# Patient Record
Sex: Female | Born: 1945
Health system: Southern US, Community
[De-identification: ages and names within clinical notes are randomized; demographics above are authoritative.]

## PROBLEM LIST (undated history)

## (undated) DIAGNOSIS — T8859XA Other complications of anesthesia, initial encounter: Secondary | ICD-10-CM

## (undated) DIAGNOSIS — I10 Essential (primary) hypertension: Secondary | ICD-10-CM

## (undated) DIAGNOSIS — E785 Hyperlipidemia, unspecified: Secondary | ICD-10-CM

## (undated) DIAGNOSIS — K219 Gastro-esophageal reflux disease without esophagitis: Secondary | ICD-10-CM

## (undated) DIAGNOSIS — M1990 Unspecified osteoarthritis, unspecified site: Secondary | ICD-10-CM

## (undated) DIAGNOSIS — T4145XA Adverse effect of unspecified anesthetic, initial encounter: Secondary | ICD-10-CM

## (undated) DIAGNOSIS — J189 Pneumonia, unspecified organism: Secondary | ICD-10-CM

## (undated) DIAGNOSIS — Z87442 Personal history of urinary calculi: Secondary | ICD-10-CM

## (undated) DIAGNOSIS — D126 Benign neoplasm of colon, unspecified: Secondary | ICD-10-CM

## (undated) DIAGNOSIS — M719 Bursopathy, unspecified: Secondary | ICD-10-CM

## (undated) DIAGNOSIS — K5790 Diverticulosis of intestine, part unspecified, without perforation or abscess without bleeding: Secondary | ICD-10-CM

## (undated) DIAGNOSIS — G4730 Sleep apnea, unspecified: Secondary | ICD-10-CM

## (undated) HISTORY — PX: ABDOMINAL HYSTERECTOMY: SHX81

## (undated) HISTORY — PX: JOINT REPLACEMENT: SHX530

## (undated) HISTORY — PX: CARPAL TUNNEL RELEASE: SHX101

## (undated) HISTORY — PX: DILATION AND CURETTAGE OF UTERUS: SHX78

## (undated) HISTORY — PX: TONSILLECTOMY: SUR1361

## (undated) HISTORY — PX: EYE SURGERY: SHX253

---

## 1998-12-05 ENCOUNTER — Encounter: Payer: Self-pay | Admitting: Emergency Medicine

## 1998-12-05 ENCOUNTER — Ambulatory Visit (HOSPITAL_COMMUNITY): Admission: RE | Admit: 1998-12-05 | Discharge: 1998-12-05 | Payer: Self-pay | Admitting: Internal Medicine

## 1999-01-31 ENCOUNTER — Emergency Department (HOSPITAL_COMMUNITY): Admission: EM | Admit: 1999-01-31 | Discharge: 1999-02-01 | Payer: Self-pay | Admitting: Emergency Medicine

## 1999-11-23 ENCOUNTER — Encounter: Payer: Self-pay | Admitting: Emergency Medicine

## 1999-11-23 ENCOUNTER — Ambulatory Visit (HOSPITAL_COMMUNITY): Admission: RE | Admit: 1999-11-23 | Discharge: 1999-11-23 | Payer: Self-pay | Admitting: Emergency Medicine

## 1999-12-06 ENCOUNTER — Other Ambulatory Visit: Admission: RE | Admit: 1999-12-06 | Discharge: 1999-12-06 | Payer: Self-pay | Admitting: *Deleted

## 2000-07-08 ENCOUNTER — Encounter: Payer: Self-pay | Admitting: Emergency Medicine

## 2000-07-08 ENCOUNTER — Encounter: Admission: RE | Admit: 2000-07-08 | Discharge: 2000-07-08 | Payer: Self-pay | Admitting: Emergency Medicine

## 2000-12-05 ENCOUNTER — Ambulatory Visit (HOSPITAL_COMMUNITY): Admission: RE | Admit: 2000-12-05 | Discharge: 2000-12-05 | Payer: Self-pay | Admitting: Emergency Medicine

## 2000-12-05 ENCOUNTER — Encounter: Payer: Self-pay | Admitting: Emergency Medicine

## 2001-04-03 ENCOUNTER — Encounter: Admission: RE | Admit: 2001-04-03 | Discharge: 2001-04-03 | Payer: Self-pay | Admitting: Emergency Medicine

## 2001-04-03 ENCOUNTER — Encounter: Payer: Self-pay | Admitting: Emergency Medicine

## 2003-04-14 ENCOUNTER — Encounter: Admission: RE | Admit: 2003-04-14 | Discharge: 2003-04-14 | Payer: Self-pay | Admitting: Emergency Medicine

## 2003-04-14 ENCOUNTER — Encounter: Payer: Self-pay | Admitting: Emergency Medicine

## 2003-04-21 ENCOUNTER — Encounter: Payer: Self-pay | Admitting: Emergency Medicine

## 2003-04-21 ENCOUNTER — Encounter: Admission: RE | Admit: 2003-04-21 | Discharge: 2003-04-21 | Payer: Self-pay | Admitting: Emergency Medicine

## 2003-05-07 ENCOUNTER — Encounter: Admission: RE | Admit: 2003-05-07 | Discharge: 2003-05-07 | Payer: Self-pay | Admitting: Emergency Medicine

## 2003-05-07 ENCOUNTER — Encounter: Payer: Self-pay | Admitting: Emergency Medicine

## 2003-05-12 ENCOUNTER — Ambulatory Visit (HOSPITAL_COMMUNITY): Admission: RE | Admit: 2003-05-12 | Discharge: 2003-05-12 | Payer: Self-pay | Admitting: Emergency Medicine

## 2003-05-12 ENCOUNTER — Encounter: Payer: Self-pay | Admitting: Cardiovascular Disease

## 2003-05-17 ENCOUNTER — Encounter: Admission: RE | Admit: 2003-05-17 | Discharge: 2003-05-17 | Payer: Self-pay | Admitting: Emergency Medicine

## 2003-05-17 ENCOUNTER — Encounter: Payer: Self-pay | Admitting: Emergency Medicine

## 2003-06-01 ENCOUNTER — Encounter: Admission: RE | Admit: 2003-06-01 | Discharge: 2003-06-01 | Payer: Self-pay | Admitting: Emergency Medicine

## 2003-06-01 ENCOUNTER — Encounter: Payer: Self-pay | Admitting: Emergency Medicine

## 2003-06-08 ENCOUNTER — Ambulatory Visit (HOSPITAL_COMMUNITY): Admission: RE | Admit: 2003-06-08 | Discharge: 2003-06-08 | Payer: Self-pay | Admitting: Emergency Medicine

## 2003-06-08 ENCOUNTER — Encounter: Payer: Self-pay | Admitting: Emergency Medicine

## 2004-02-17 ENCOUNTER — Encounter: Admission: RE | Admit: 2004-02-17 | Discharge: 2004-02-17 | Payer: Self-pay | Admitting: Gastroenterology

## 2004-02-27 ENCOUNTER — Encounter
Admission: RE | Admit: 2004-02-27 | Discharge: 2004-02-27 | Payer: Self-pay | Admitting: Physical Medicine and Rehabilitation

## 2004-03-08 ENCOUNTER — Ambulatory Visit (HOSPITAL_COMMUNITY): Admission: RE | Admit: 2004-03-08 | Discharge: 2004-03-08 | Payer: Self-pay | Admitting: Gastroenterology

## 2004-03-08 ENCOUNTER — Encounter (INDEPENDENT_AMBULATORY_CARE_PROVIDER_SITE_OTHER): Payer: Self-pay | Admitting: Specialist

## 2004-04-19 ENCOUNTER — Ambulatory Visit (HOSPITAL_COMMUNITY): Admission: RE | Admit: 2004-04-19 | Discharge: 2004-04-19 | Payer: Self-pay | Admitting: Internal Medicine

## 2005-07-21 IMAGING — NM NM HEPATO W/GB/PHARM/[PERSON_NAME]
2 series · 12 of 12 positions shown · non-contrast
Comparison: none

CLINICAL DATA: Nausea.
 NUCLEAR MEDICINE HEPATOBILIARY SCAN WITH EJECTION FRACTION
 Routine imaging was carried [DATE] minutes following IV injection of 5.0 mCi of 6echnetium-LLm Choletec.
 There is prompt visualization of the biliary tree, gallbladder, and small bowel.  
 Following 8 ounces of Half-and-Half cream, ejection fraction is calculated at 70% at 30 minutes.
 IMPRESSION
 1.  Patent cystic and common bile duct. 
 2.  The gallbladder contracts physiologically.

[gb hepatobiliary · 4.66mm/px · 6 of 12 frames shown (1 of 2)]
[frame 2/12]
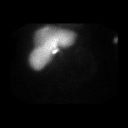
[frame 4/12]
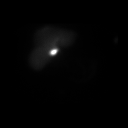
[frame 6/12]
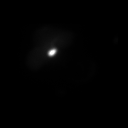
[frame 8/12]
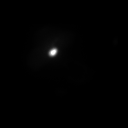
[frame 10/12]
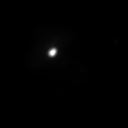
[frame 12/12]
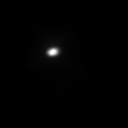

[gb hepatobiliary · 4.66mm/px · 6 of 30 frames shown (2 of 2)]
[frame 3/30]
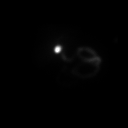
[frame 8/30]
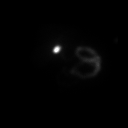
[frame 13/30]
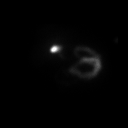
[frame 18/30]
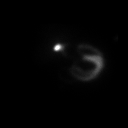
[frame 23/30]
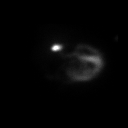
[frame 28/30]
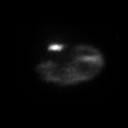

[12 of 12 positions shown; findings below may reference images not displayed]

## 2005-10-31 ENCOUNTER — Encounter: Admission: RE | Admit: 2005-10-31 | Discharge: 2005-10-31 | Payer: Self-pay | Admitting: Orthopedic Surgery

## 2008-04-07 ENCOUNTER — Encounter: Payer: Self-pay | Admitting: Emergency Medicine

## 2008-06-15 ENCOUNTER — Ambulatory Visit: Payer: Self-pay | Admitting: Cardiology

## 2008-06-23 ENCOUNTER — Encounter: Payer: Self-pay | Admitting: Cardiology

## 2008-06-23 ENCOUNTER — Ambulatory Visit: Payer: Self-pay

## 2008-06-25 ENCOUNTER — Ambulatory Visit: Payer: Self-pay

## 2008-07-14 ENCOUNTER — Ambulatory Visit: Payer: Self-pay | Admitting: Cardiology

## 2008-08-19 ENCOUNTER — Ambulatory Visit: Payer: Self-pay | Admitting: Emergency Medicine

## 2008-08-19 DIAGNOSIS — F3289 Other specified depressive episodes: Secondary | ICD-10-CM | POA: Insufficient documentation

## 2008-08-19 DIAGNOSIS — M4800 Spinal stenosis, site unspecified: Secondary | ICD-10-CM

## 2008-08-19 DIAGNOSIS — J45909 Unspecified asthma, uncomplicated: Secondary | ICD-10-CM | POA: Insufficient documentation

## 2008-08-19 DIAGNOSIS — E785 Hyperlipidemia, unspecified: Secondary | ICD-10-CM | POA: Insufficient documentation

## 2008-08-19 DIAGNOSIS — K219 Gastro-esophageal reflux disease without esophagitis: Secondary | ICD-10-CM

## 2008-08-19 DIAGNOSIS — G5600 Carpal tunnel syndrome, unspecified upper limb: Secondary | ICD-10-CM

## 2008-08-19 DIAGNOSIS — F329 Major depressive disorder, single episode, unspecified: Secondary | ICD-10-CM

## 2008-08-19 DIAGNOSIS — R059 Cough, unspecified: Secondary | ICD-10-CM | POA: Insufficient documentation

## 2008-08-19 DIAGNOSIS — G4730 Sleep apnea, unspecified: Secondary | ICD-10-CM | POA: Insufficient documentation

## 2008-08-19 DIAGNOSIS — R05 Cough: Secondary | ICD-10-CM

## 2008-08-19 DIAGNOSIS — I10 Essential (primary) hypertension: Secondary | ICD-10-CM | POA: Insufficient documentation

## 2008-09-16 ENCOUNTER — Ambulatory Visit: Payer: Self-pay | Admitting: Emergency Medicine

## 2008-09-16 DIAGNOSIS — J984 Other disorders of lung: Secondary | ICD-10-CM | POA: Insufficient documentation

## 2010-05-09 ENCOUNTER — Ambulatory Visit (HOSPITAL_COMMUNITY): Admission: RE | Admit: 2010-05-09 | Discharge: 2010-05-09 | Payer: Self-pay | Admitting: Orthopedic Surgery

## 2010-06-09 ENCOUNTER — Inpatient Hospital Stay (HOSPITAL_COMMUNITY): Admission: RE | Admit: 2010-06-09 | Discharge: 2010-06-14 | Payer: Self-pay | Admitting: Orthopedic Surgery

## 2010-09-18 ENCOUNTER — Telehealth (INDEPENDENT_AMBULATORY_CARE_PROVIDER_SITE_OTHER): Payer: Self-pay | Admitting: *Deleted

## 2010-09-22 ENCOUNTER — Inpatient Hospital Stay (HOSPITAL_COMMUNITY): Admission: RE | Admit: 2010-09-22 | Discharge: 2010-09-27 | Payer: Self-pay | Admitting: Orthopedic Surgery

## 2011-01-11 NOTE — Progress Notes (Signed)
Summary: Records Request  Faxed Stress & EKG to [facility] at [facility] ([phone]). Debby Freiberg  September 18, 2010 11:19 AM

## 2011-02-21 LAB — CBC
HCT: 39.4 % (ref 36.0–46.0)
HCT: 39.6 % (ref 36.0–46.0)
MCH: 30 pg (ref 26.0–34.0)
MCH: 30.2 pg (ref 26.0–34.0)
MCHC: 31.6 g/dL (ref 30.0–36.0)
MCHC: 32.4 g/dL (ref 30.0–36.0)
MCV: 94.7 fL (ref 78.0–100.0)
MCV: 95.2 fL (ref 78.0–100.0)
Platelets: 201 10*3/uL (ref 150–400)
Platelets: 206 10*3/uL (ref 150–400)
RBC: 4.1 MIL/uL (ref 3.87–5.11)
RBC: 4.16 MIL/uL (ref 3.87–5.11)
RBC: 4.16 MIL/uL (ref 3.87–5.11)
RDW: 15 % (ref 11.5–15.5)
WBC: 15.4 10*3/uL — ABNORMAL HIGH (ref 4.0–10.5)
WBC: 17.2 10*3/uL — ABNORMAL HIGH (ref 4.0–10.5)
WBC: 17.9 10*3/uL — ABNORMAL HIGH (ref 4.0–10.5)

## 2011-02-21 LAB — GLUCOSE, CAPILLARY
Glucose-Capillary: 133 mg/dL — ABNORMAL HIGH (ref 70–99)
Glucose-Capillary: 134 mg/dL — ABNORMAL HIGH (ref 70–99)
Glucose-Capillary: 148 mg/dL — ABNORMAL HIGH (ref 70–99)
Glucose-Capillary: 168 mg/dL — ABNORMAL HIGH (ref 70–99)

## 2011-02-21 LAB — BASIC METABOLIC PANEL
BUN: 12 mg/dL (ref 6–23)
BUN: 5 mg/dL — ABNORMAL LOW (ref 6–23)
BUN: 6 mg/dL (ref 6–23)
CO2: 31 mEq/L (ref 19–32)
Chloride: 102 mEq/L (ref 96–112)
Creatinine, Ser: 0.74 mg/dL (ref 0.4–1.2)
GFR calc Af Amer: >60 mL/min (ref >60)
GFR calc non Af Amer: >60 mL/min (ref >60)
GFR calc non Af Amer: >60 mL/min (ref >60)
Glucose, Bld: 193 mg/dL — ABNORMAL HIGH (ref 70–99)
Potassium: 3 mEq/L — ABNORMAL LOW (ref 3.5–5.1)
Potassium: 3.4 mEq/L — ABNORMAL LOW (ref 3.5–5.1)
Sodium: 136 mEq/L (ref 135–145)

## 2011-02-22 LAB — URINALYSIS, ROUTINE W REFLEX MICROSCOPIC
Bilirubin Urine: NEGATIVE
Hgb urine dipstick: NEGATIVE
Ketones, ur: NEGATIVE mg/dL
Nitrite: NEGATIVE
pH: 7.5 (ref 5.0–8.0)

## 2011-02-22 LAB — TYPE AND SCREEN
ABO/RH(D): A POS
ABO/RH(D): A POS
Antibody Screen: NEGATIVE
Antibody Screen: NEGATIVE

## 2011-02-22 LAB — PROTIME-INR: Prothrombin Time: 12.6 seconds (ref 11.6–15.2)

## 2011-02-22 LAB — APTT: aPTT: 25 seconds (ref 24–37)

## 2011-02-22 LAB — BASIC METABOLIC PANEL
BUN: 9 mg/dL (ref 6–23)
Calcium: 9.7 mg/dL (ref 8.4–10.5)
Creatinine, Ser: 0.63 mg/dL (ref 0.4–1.2)
GFR calc Af Amer: >60 mL/min (ref >60)

## 2011-02-22 LAB — CBC
HCT: 43 % (ref 36.0–46.0)
Hemoglobin: 14.3 g/dL (ref 12.0–15.0)
RBC: 4.62 MIL/uL (ref 3.87–5.11)

## 2011-02-22 LAB — GLUCOSE, CAPILLARY
Glucose-Capillary: 115 mg/dL — ABNORMAL HIGH (ref 70–99)
Glucose-Capillary: 137 mg/dL — ABNORMAL HIGH (ref 70–99)

## 2011-02-22 LAB — DIFFERENTIAL
Eosinophils Relative: 1 % (ref 0–5)
Lymphocytes Relative: 20 % (ref 12–46)
Lymphs Abs: 2.9 10*3/uL (ref 0.7–4.0)
Monocytes Absolute: 0.7 10*3/uL (ref 0.1–1.0)
Monocytes Relative: 5 % (ref 3–12)
Neutro Abs: 10.6 10*3/uL — ABNORMAL HIGH (ref 1.7–7.7)

## 2011-02-22 LAB — SURGICAL PCR SCREEN: MRSA, PCR: NEGATIVE

## 2011-02-22 LAB — ABO/RH: ABO/RH(D): A POS

## 2011-02-25 LAB — GLUCOSE, CAPILLARY
Glucose-Capillary: 134 mg/dL — ABNORMAL HIGH (ref 70–99)
Glucose-Capillary: 134 mg/dL — ABNORMAL HIGH (ref 70–99)
Glucose-Capillary: 136 mg/dL — ABNORMAL HIGH (ref 70–99)
Glucose-Capillary: 138 mg/dL — ABNORMAL HIGH (ref 70–99)
Glucose-Capillary: 145 mg/dL — ABNORMAL HIGH (ref 70–99)
Glucose-Capillary: 153 mg/dL — ABNORMAL HIGH (ref 70–99)
Glucose-Capillary: 156 mg/dL — ABNORMAL HIGH (ref 70–99)
Glucose-Capillary: 157 mg/dL — ABNORMAL HIGH (ref 70–99)
Glucose-Capillary: 164 mg/dL — ABNORMAL HIGH (ref 70–99)

## 2011-02-25 LAB — CBC
HCT: 35 % — ABNORMAL LOW (ref 36.0–46.0)
HCT: 37 % (ref 36.0–46.0)
Hemoglobin: 12 g/dL (ref 12.0–15.0)
Hemoglobin: 12.6 g/dL (ref 12.0–15.0)
Hemoglobin: 13.6 g/dL (ref 12.0–15.0)
MCH: 31.3 pg (ref 26.0–34.0)
MCH: 31.5 pg (ref 26.0–34.0)
MCH: 31.5 pg (ref 26.0–34.0)
MCH: 30.5 pg (ref 26.0–34.0)
MCHC: 33.9 g/dL (ref 30.0–36.0)
MCHC: 34.1 g/dL (ref 30.0–36.0)
MCV: 91.5 fL (ref 78.0–100.0)
MCV: 92.6 fL (ref 78.0–100.0)
MCV: 92.7 fL (ref 78.0–100.0)
MCV: 92.8 fL (ref 78.0–100.0)
Platelets: 183 K/uL (ref 150–400)
Platelets: 208 10*3/uL (ref 150–400)
RBC: 3.83 MIL/uL — ABNORMAL LOW (ref 3.87–5.11)
RBC: 3.97 MIL/uL (ref 3.87–5.11)
RBC: 4 MIL/uL (ref 3.87–5.11)
RBC: 4.47 MIL/uL (ref 3.87–5.11)
RDW: 14.4 % (ref 11.5–15.5)
WBC: 17.1 K/uL — ABNORMAL HIGH (ref 4.0–10.5)

## 2011-02-25 LAB — PROTIME-INR
INR: 1.22 (ref 0.00–1.49)
INR: 2.22 — ABNORMAL HIGH (ref 0.00–1.49)
Prothrombin Time: 15.3 seconds — ABNORMAL HIGH (ref 11.6–15.2)
Prothrombin Time: 21.8 seconds — ABNORMAL HIGH (ref 11.6–15.2)
Prothrombin Time: 23.7 seconds — ABNORMAL HIGH (ref 11.6–15.2)
Prothrombin Time: 24.4 s — ABNORMAL HIGH (ref 11.6–15.2)

## 2011-02-25 LAB — BASIC METABOLIC PANEL WITH GFR
BUN: 13 mg/dL (ref 6–23)
CO2: 29 meq/L (ref 19–32)
Calcium: 8.4 mg/dL (ref 8.4–10.5)
Chloride: 106 meq/L (ref 96–112)
Creatinine, Ser: 0.71 mg/dL (ref 0.4–1.2)
GFR calc non Af Amer: >60 mL/min
Glucose, Bld: 171 mg/dL — ABNORMAL HIGH (ref 70–99)
Potassium: 3.8 meq/L (ref 3.5–5.1)
Sodium: 142 meq/L (ref 135–145)

## 2011-02-25 LAB — BASIC METABOLIC PANEL
BUN: 11 mg/dL (ref 6–23)
CO2: 31 mEq/L (ref 19–32)
CO2: 31 mEq/L (ref 19–32)
Chloride: 102 mEq/L (ref 96–112)
Chloride: 103 mEq/L (ref 96–112)
Chloride: 105 mEq/L (ref 96–112)
Creatinine, Ser: 0.64 mg/dL (ref 0.4–1.2)
GFR calc Af Amer: >60 mL/min (ref >60)
GFR calc Af Amer: >60 mL/min (ref >60)
Glucose, Bld: 180 mg/dL — ABNORMAL HIGH (ref 70–99)
Potassium: 3.8 mEq/L (ref 3.5–5.1)
Sodium: 139 mEq/L (ref 135–145)
Sodium: 143 mEq/L (ref 135–145)

## 2011-02-25 LAB — DIFFERENTIAL
Eosinophils Absolute: 0 10*3/uL (ref 0.0–0.7)
Eosinophils Relative: 0 % (ref 0–5)
Lymphs Abs: 3.2 10*3/uL (ref 0.7–4.0)
Monocytes Absolute: 0.8 10*3/uL (ref 0.1–1.0)
Monocytes Relative: 5 % (ref 3–12)

## 2011-02-25 LAB — URINALYSIS, ROUTINE W REFLEX MICROSCOPIC
Glucose, UA: NEGATIVE mg/dL
Glucose, UA: NEGATIVE mg/dL
Hgb urine dipstick: NEGATIVE
Protein, ur: NEGATIVE mg/dL
pH: 6 (ref 5.0–8.0)

## 2011-02-25 LAB — TYPE AND SCREEN: Antibody Screen: NEGATIVE

## 2011-02-25 LAB — URINE MICROSCOPIC-ADD ON

## 2011-02-25 LAB — ABO/RH: ABO/RH(D): A POS

## 2011-02-26 LAB — BASIC METABOLIC PANEL
BUN: 8 mg/dL (ref 6–23)
Chloride: 103 mEq/L (ref 96–112)
Glucose, Bld: 244 mg/dL — ABNORMAL HIGH (ref 70–99)
Potassium: 3.4 mEq/L — ABNORMAL LOW (ref 3.5–5.1)

## 2011-02-26 LAB — CBC
HCT: 40.5 % (ref 36.0–46.0)
MCV: 91.6 fL (ref 78.0–100.0)
Platelets: 255 10*3/uL (ref 150–400)
RDW: 14.9 % (ref 11.5–15.5)
WBC: 12.6 10*3/uL — ABNORMAL HIGH (ref 4.0–10.5)

## 2011-02-26 LAB — PROTIME-INR: Prothrombin Time: 13.5 seconds (ref 11.6–15.2)

## 2011-02-26 LAB — DIFFERENTIAL
Eosinophils Absolute: 0.1 10*3/uL (ref 0.0–0.7)
Eosinophils Relative: 1 % (ref 0–5)
Lymphs Abs: 2 10*3/uL (ref 0.7–4.0)
Monocytes Absolute: 0.5 10*3/uL (ref 0.1–1.0)

## 2011-02-26 LAB — URINALYSIS, ROUTINE W REFLEX MICROSCOPIC
Bilirubin Urine: NEGATIVE
Glucose, UA: NEGATIVE mg/dL
Ketones, ur: NEGATIVE mg/dL
pH: 7 (ref 5.0–8.0)

## 2011-04-24 NOTE — Assessment & Plan Note (Signed)
[facility]                            CARDIOLOGY OFFICE NOTE   NAME:Margaret Rocha                      MRN:          [id number]  DATE:07/14/2008                            DOB:          1946/03/31    PRIMARY CARE PHYSICIAN:  Margaret Rocha   REASON FOR PRESENTATION:  Evaluate the patient with cough and coronary  calcification on CT.   HISTORY OF PRESENT ILLNESS:  This is a second office visit for this  pleasant 65 year old.  She was referred for evaluation of a cough.  She  also had a CT demonstrating possible pulmonary hypertension and coronary  calcium.  To evaluate the coronary calcium, I did send her for a stress  perfusion study, which demonstrated an ejection fraction of 55%, and no  evidence of ischemia or infarct.  An echocardiogram to evaluate her  right ventricular size and pulmonary pressures was somewhat suboptimal  with not great acoustic windows.  She had an overall well-preserved left  ventricular function with no wall motion abnormalities.  The RV was not  well visualized, though there was no obvious tricuspid regurgitation or  elevated pulmonary pressures.  The inferior vena cava appeared to be  normal and collapsing.   The patient has since had her medications changed to include Kapidex to  see if this might help her coughing.  She states her coughing is  somewhat better, though she is still doing this.  She has not been  having any new symptoms.  She joined the wine, is going to start some  water aerobics.  With her current level of activity, she is not having  any chest pressure, neck or arm discomfort.  She is not having any  palpitations, presyncope, or syncope.  She is not having any PND or  orthopnea.   PAST MEDICAL HISTORY:  Diabetes mellitus, borderline; hypertension times  many years; sleep apnea, on CPAP; dyslipidemia; depression; carpal  tunnel syndrome; asthma/bronchitis; reflux; insomnia; cataracts;  hysterectomy.   ALLERGIES/INTOLERANCES:  None.   MEDICATIONS:  1. Diovan HCT 320/25.  2. Kapidex 60 mg daily.  3. Cymbalta 60 mg daily.  4. Veramyst.  5. Wellbutrin 150 mg daily.  6. Ditropan 5 mg daily.  7. Folic acid 1 mg daily.  8. Fish Oil 1000 mg daily.  9. Micro-K 20 mEq b.i.d.  10.Cyanocobalamin.  11.Aspirin 81 mg daily.   REVIEW OF SYSTEMS:  As stated in the HPI and otherwise negative for  other systems.   PHYSICAL EXAMINATION:  GENERAL:  The patient is in no distress.  VITAL SIGNS:  Blood pressure 150/90, heart rate 80 and regular, weight  242 pounds, body mass index 47.5.  HEENT:  Eyes unremarkable; pupils equal, round, and react to light;  fundi not visualized; oral mucosa unremarkable.  NECK:  No jugular distention at 45 degrees; carotid upstroke brisk and  symmetric; no bruits, no thyromegaly.  LYMPHATICS:  No cervical, axillary, or inguinal adenopathy.  LUNGS:  Clear to auscultation bilaterally.  HEART:  PMI not displaced or sustained; S1 and S2 within normal limits;  no S3, no murmurs.  ABDOMEN:  Morbidly obese; positive bowel sounds, normal in frequency and  pitch; no bruits, rebound, guarding or midline pulsatile mass; no  organomegaly.  SKIN:  No rashes, no nodules.  EXTREMITIES:  A 2+ pulses, no edema.   ASSESSMENT AND PLAN:  1. Cough.  The patient's cough is perhaps slightly better with the      Kapidex.  At this point, I do not see an overt cardiac etiology.      It could be her pulmonary hypertension.  It is very difficult to      demonstrate on physical exam given her size or on echo.  However,      this is not overt.  If there is some pulmonary hypertension, I      suspect it would be multifactorial and somewhat related to her      weight and perhaps her primary lung problem.  I have given her Dr.      Kavin Leech name.  She was seen by pulmonologist in the past in [facility], but was not satisfied.  Dr. Delton Coombes is pulmonologist in our       office, and I would suggest she see him next about the cough.  If      there is any further question about the cardiac etiology, I      consider right heart catheterization.  2. Coronary calcification.  The patient definitely has some coronary      plaque.  However, it does not appear to be obstructive according to      the stress test.  She should have aggressive risk reduction.  3. Dyslipidemia.  The patient does have significant cardiovascular      risk factors and borderline diabetes.  Given this, I would suggest      a statin with goal LDL less than 100 and HDL greater than 50.  She      wants to have this conversation with her primary care physician,      and I will defer.  4. Obesity.  She understands the need to lose weight with diet and      exercise.  5. Followup.  The patient will come back to see me as needed.     Rollene Rotunda, MD, [facility]  Electronically Signed    JH/MedQ  DD: 07/14/2008  DT: 07/15/2008  Job #: [id number]   cc:   Philemon Kingdom

## 2011-04-24 NOTE — Assessment & Plan Note (Signed)
[facility]                            CARDIOLOGY OFFICE NOTE   NAME:Margaret Rocha, Margaret Rocha                      MRN:          [id number]  DATE:06/15/2008                            DOB:          June 26, 1946    PRIMARY CARE PHYSICIAN:  Philemon Kingdom.   REASON FOR PRESENTATION:  Evaluate the patient with pulmonary  hypertension, coronary artery disease noted on a CT and cough.   HISTORY OF PRESENT ILLNESS:  The patient is a pleasant 65 year old who I  saw in 2004.  At that time she had some dyspnea.  An echocardiogram did  not suggest a cardiovascular cause.  No further cardiovascular testing  was suggested.   The patient had done relatively well until January.  She developed a  cough.  This has been progressive.  It is daily.  She says it happens  very frequently.  It is typically nonproductive though very occasionally  productive of thick sputum.  It keeps her up at night.  She does not  associate it with any foods.  It does not happen with any particular  activity.  She was recently appropriately taken off benazepril for the  last 5 weeks, but still has not had any improvement.  She has been on  Diovan instead.  She used to have heartburn, but she does not get this  anymore.  She does not have any shortness of breath.  She denies any PND  or orthopnea.  She has no chest discomfort, neck or arm discomfort.  She  has no palpitation.  She did have an episode of presyncope and saw a  cardiologist, had a 24-hour Holter monitor, but there was apparently no  dysrhythmia.   Most recently, she had a chest CT to evaluate this cough.  This  suggested pulmonary hypertension with an enlarged pulmonary artery.  It  also mentioned multivessel coronary artery disease apparently  identifying calcification.  The patient is now referred for evaluation  of these findings and her cough.   PAST MEDICAL HISTORY:  1. Diabetes mellitus, borderline.  2. Hypertension  times many years.  3. Sleep apnea on CPAP.  4. Hyperlipidemia.  5. Depression.  6. Carpal tunnel syndrome.  7. Asthma/bronchitis.  8. Reflux.  9. Insomnia.  10.Cataracts.   PAST SURGICAL HISTORY:  Hysterectomy, cataract surgery.   ALLERGIES:  None.   MEDICATIONS:  1. Cymbalta 60 mg daily  2. aspirin 81 mg daily.  3. Cyanocobalamin.  4. Micro-K 20 mEq b.i.d.  5. Folic acid 1 mg daily.  6. Fish oil 1000 mEq daily.  7. Omeprazole 40 mg daily.  8. Proventil.  9. Wellbutrin XL 150 mg daily.  10.Ditropan 5 mg daily.  11.Diovan 160/25 daily.   SOCIAL HISTORY:  The patient is retired.  Her husband died a few years  ago.  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  She has two children.  She does  not smoke cigarettes or drink alcohol.   FAMILY HISTORY:  Contributory for mother dying of a myocardial function  aged 12.   REVIEW OF SYSTEMS:  As stated in the  HPI and positive for urinary  frequency, joint pains, scoliosis, and mild ankle edema.  Negative for  all other systems.   PHYSICAL EXAMINATION:  GENERAL:  The patient is in no acute distress.  VITALS:  Blood pressure 162/94, heart rate 92 and regular, weight 138  pounds.  HEENT:  Eyes unremarkable , pupils equal, round, reactive to light,  fundi within normal limits, oral mucosa normal.  NECK:  No jugular distention to 45 degrees, carotid upstroke brisk and  symmetrical, no bruits, no thyromegaly.  LYMPHATICS:  No cervical, axillary, inguinal.  LUNGS:  Clear to auscultation bilaterally.  No wheezing, no crackles, no  dullness to percussion.  BACK:  No costovertebral tenderness.  CHEST:  Unremarkable.  HEART:  PMI not displaced or sustained, S1 and S2 within normal limits,  no S3, no S4, no clicks, no rubs, no murmurs.  ABDOMEN:  Morbidly obese,  positive bowel sounds, normal in frequency and pitch, no bruits, no  rebound, no guarding, no midline pulsatile mass, no hepatomegaly or  splenomegaly.  SKIN:  No rashes, no nodules.   EXTREMITIES:  2+ pulses throughout, no edema, no cyanosis or clubbing.  NEURO:  Oriented to person, place, and time, cranial nerves II through  XII grossly intact, motor grossly intact.   EKG sinus rhythm, rate 92, axis within normal limits, intervals within  normal limit, poor anterior R wave progression, no acute ST-T wave  changes.   ASSESSMENT/PLAN:  1. Cough.  Etiology of this could be related to pulmonary      hypertension.  This also could be reflux without pain.  I am going      to actually increase her omeprazole for a couple weeks to 80 mg a      day.  We might need to switch to a different agent.  I have asked      to keep the head of her bed elevated.  I think it was very good      thought to suggest this might be related to the ACE, but it has not      yet gotten better.  Sometimes ACE-induced cough can take a while.      I am going to encourage her to continue with the angiotensin      receptor blocker (ARB) for another few weeks.  If the cough is not      improved, I would suggest since the ACE/hydrochlorothiazide      combination worked better for a blood pressure per her report that      we could switch back to this.  In the meantime, I will get an      echocardiogram to begin to work up whether or not she truly has      pulmonary hypertension.  2. Atherosclerotic heart disease.  This was demonstrated on a CT and I      suspect that they are indicating that there was calcium there.      Real question is whether she has obstructive coronary disease.      Given this finding and her risk factors, she needs screening with a      stress perfusion study.  This will have to be a two-part study      because of her size.  I do believe she can walk on a treadmill.  3. Hypertension as above.  She will continue the Diovan for now, but      may switch back to the to the benazepril/hydrochlorothiazide.  4. Diabetes per Dr. Sudie Bailey.  5. Hyperlipidemia.  Given her risk factors I  think the goal should be      an LDL at least less than 100 and HDL greater than 50.  I will      defer to for her primary care.  6. Followup.  I would  like to see her back in a few weeks after she      has had the echo and the Cardiolite.      Rollene Rotunda, MD, [facility]  Electronically Signed     Rollene Rotunda, MD, [facility]  Electronically Signed   JH/MedQ  DD: 06/15/2008  DT: 06/16/2008  Job #: [id number]   cc:   Philemon Kingdom

## 2011-04-27 NOTE — Op Note (Signed)
NAME:  Margaret Rocha, Margaret Rocha                         ACCOUNT NO.:  [id number]   MEDICAL RECORD NO.:  [id number]                   PATIENT TYPE:  AMB   LOCATION:  ENDO                                 FACILITY:  [facility]   PHYSICIAN:  Anselmo Rod, M.D.               DATE OF BIRTH:  December 22, 1945   DATE OF PROCEDURE:  03/08/2004  DATE OF DISCHARGE:                                 OPERATIVE REPORT   PROCEDURE PERFORMED:  Colonoscopy with snare polypectomy x1 (cold snare).   ENDOSCOPIST:  Anselmo Rod, M.D.   INSTRUMENT USED:  Olympus video colonoscope.   INDICATION FOR PROCEDURE:  A 65 year old white female with a personal  history of adenomatous polyps, undergoing a repeat colonoscopy.  Rule out  recurrent polyps.   PREPROCEDURE PREPARATION:  Informed consent was procured from the patient.  The patient was fasted for eight hours prior to the procedure and prepped  with a bottle of magnesium citrate and a gallon of GoLYTELY the night prior  to the procedure.   PREPROCEDURE PHYSICAL:  VITAL SIGNS:  The patient had stable vital signs.  NECK:  Supple.  CHEST:  Clear to auscultation.  S1, S2 regular.  ABDOMEN:  Soft with normal bowel sounds.   DESCRIPTION OF PROCEDURE:  The patient was placed in the left lateral  decubitus position and sedated with 60 mg of Demerol and 6 mg of Versed  intravenously.  Once the patient was adequately sedate and maintained on low-  flow oxygen and continuous cardiac monitoring, the Olympus video colonoscope  was advanced from the rectum to the cecum with difficulty.  There was a  large amount of residual stool in the rectum and the rectosigmoid area.  Multiple washes were done.  A small sessile polyp was removed by cold snare.  An isolated diverticulum was seen in the mid-transverse colon.  No other  abnormalities were identified.  The patient tolerated the procedure well  without complications.  The appendiceal orifice and the ileocecal valve were  clearly  visualized and photographed.  Small lesions could have been,  retroflexion in the rectum revealed no abnormalities except for a small  polyp mentioned above.   IMPRESSION:  1. Small sessile polyp snared from the rectum by cold snare.  2. Isolated diverticulum in the transverse colon.  3. Large amount of residual stool in the colon, small lesions could have     been missed.   RECOMMENDATIONS:  1. Await pathology results.  2. Avoid nonsteroidals for two weeks.  3. Repeat CRC screening depending on pathology results.  4. Outpatient follow-up as the need arises in the future.                                               Anselmo Rod, M.D.  JNM/MEDQ  D:  03/08/2004  T:  03/08/2004  Job:  [id number]   cc:   Philemon Kingdom  [id number]  [place]  [place] [address]  Fax: [phone]

## 2011-05-31 ENCOUNTER — Other Ambulatory Visit: Payer: Self-pay

## 2011-10-11 IMAGING — CR DG CHEST 2V
2 series · 2 of 2 positions shown · non-contrast
Comparison: None.

CLINICAL DATA: Preoperative cardiopulmonary evaluation.

CHEST - 2 VIEW

[w chest pa]
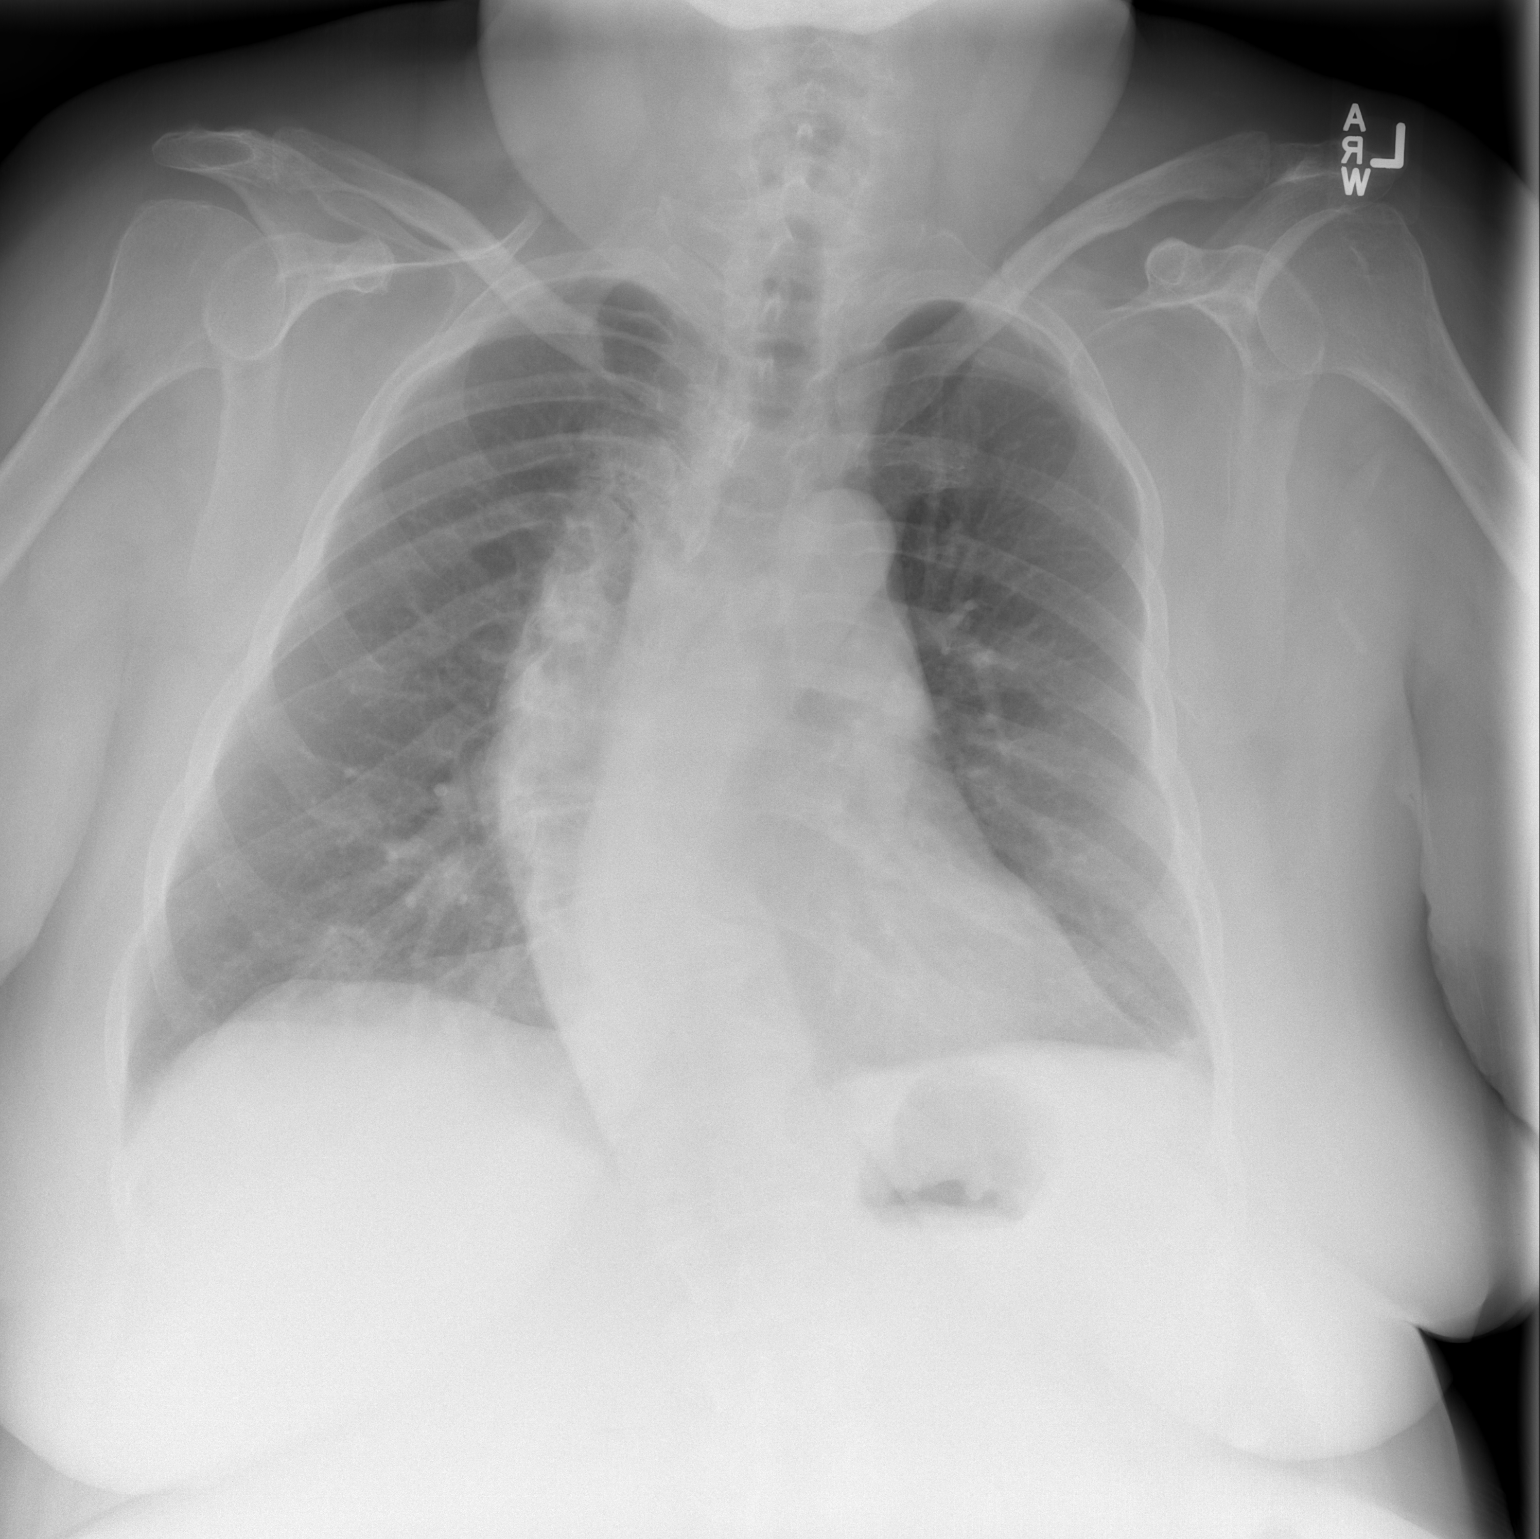

[w chest lat]
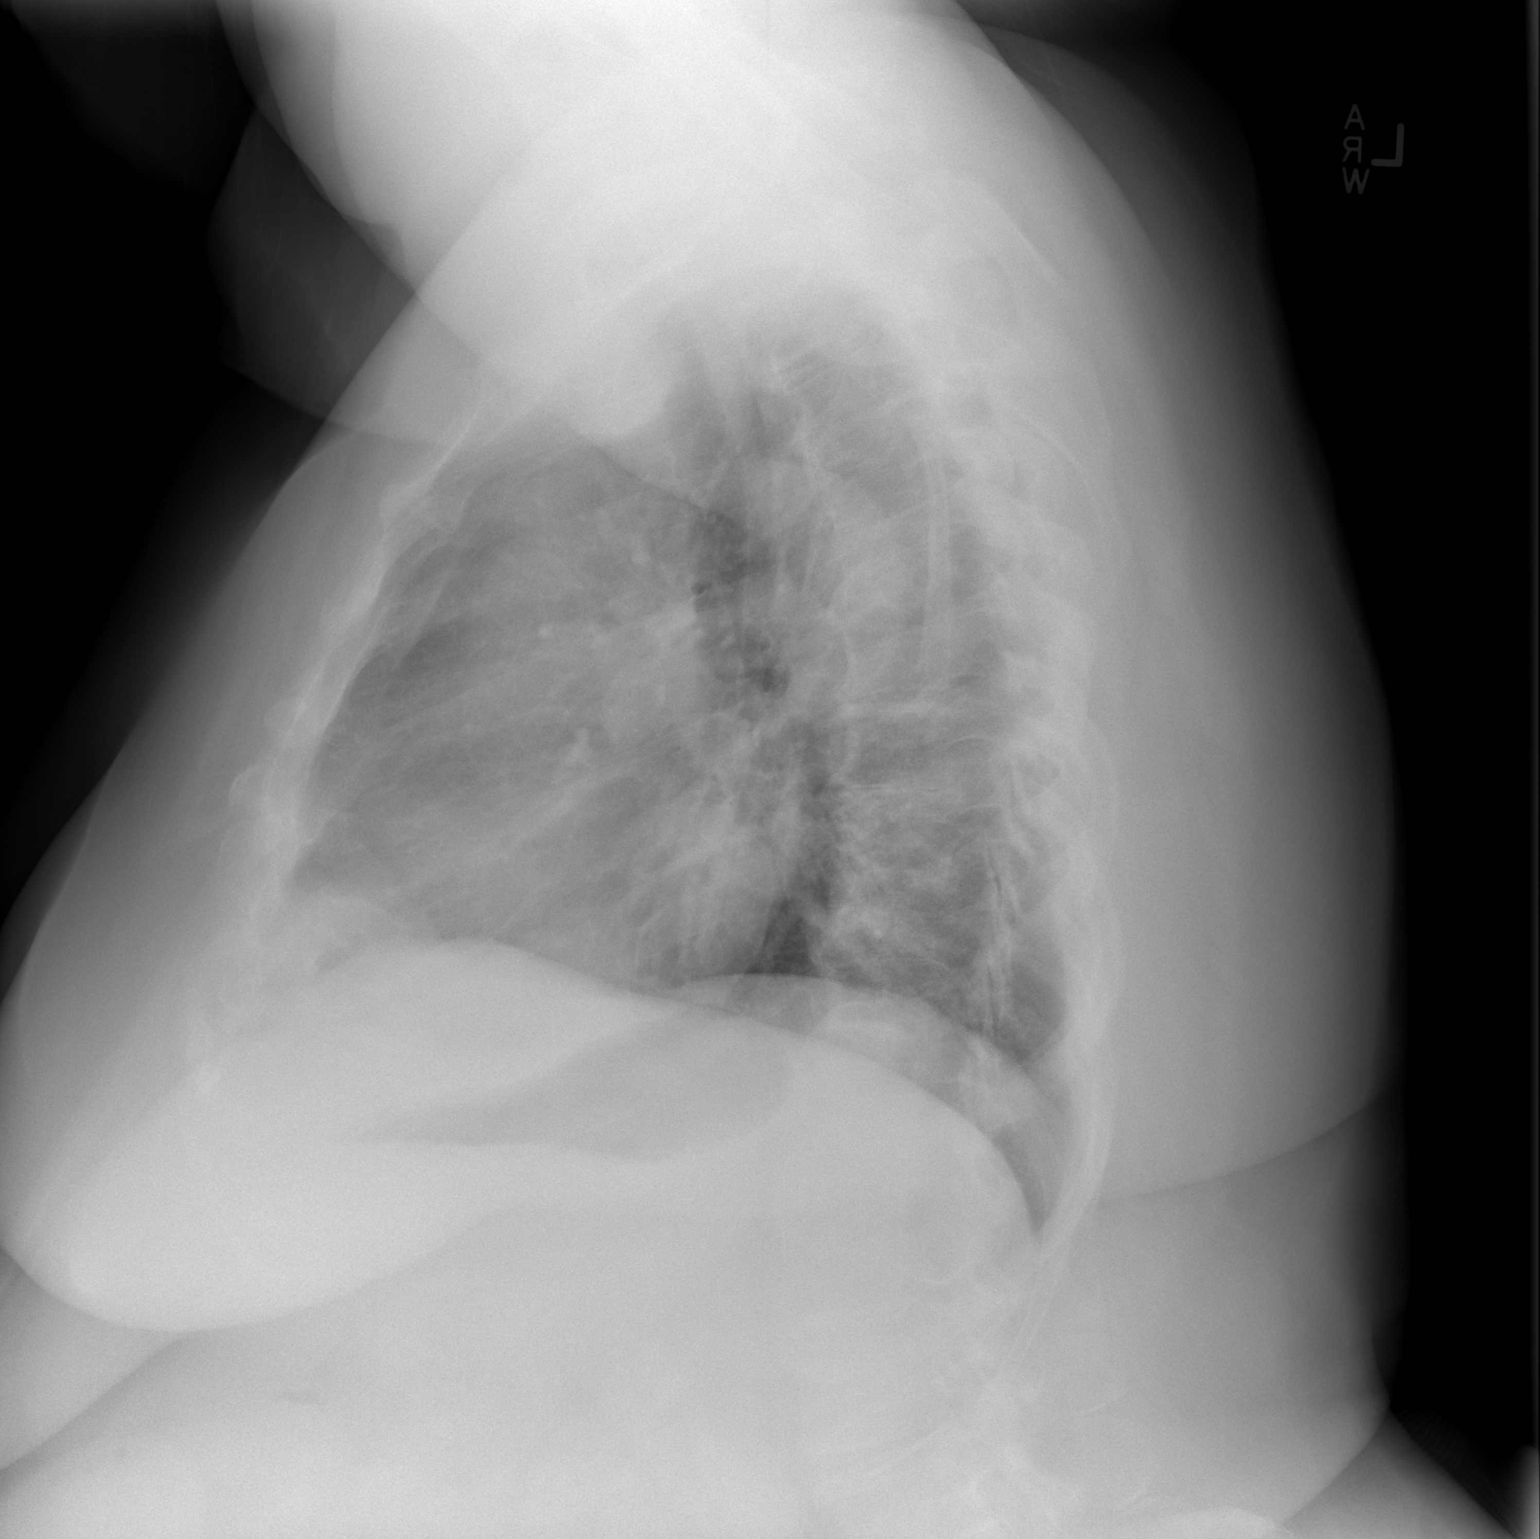

[2 of 2 positions shown; findings below may reference images not displayed]

FINDINGS: The cardiac silhouette is normal size and shape. The
lungs are well aerated and free of infiltrates. No pleural
abnormality is evident. There is scoliosis convexity to the right.
There is a mildly osteopenic appearance of the bones. There is mild
degenerative spondylosis compatible with age.
IMPRESSION: No acute or active cardiopulmonary process is seen.  Scoliosis.
Osteopenic appearance of bones.  Degenerative spondylosis.

## 2012-01-15 DIAGNOSIS — Z1211 Encounter for screening for malignant neoplasm of colon: Secondary | ICD-10-CM | POA: Diagnosis not present

## 2012-01-15 DIAGNOSIS — Z86010 Personal history of colonic polyps: Secondary | ICD-10-CM | POA: Diagnosis not present

## 2012-01-15 DIAGNOSIS — K5900 Constipation, unspecified: Secondary | ICD-10-CM | POA: Diagnosis not present

## 2012-01-15 DIAGNOSIS — K219 Gastro-esophageal reflux disease without esophagitis: Secondary | ICD-10-CM | POA: Diagnosis not present

## 2012-01-30 DIAGNOSIS — J209 Acute bronchitis, unspecified: Secondary | ICD-10-CM | POA: Diagnosis not present

## 2012-01-30 DIAGNOSIS — G252 Other specified forms of tremor: Secondary | ICD-10-CM | POA: Diagnosis not present

## 2012-01-30 DIAGNOSIS — I10 Essential (primary) hypertension: Secondary | ICD-10-CM | POA: Diagnosis not present

## 2012-01-30 DIAGNOSIS — E785 Hyperlipidemia, unspecified: Secondary | ICD-10-CM | POA: Diagnosis not present

## 2012-01-30 DIAGNOSIS — Z79899 Other long term (current) drug therapy: Secondary | ICD-10-CM | POA: Diagnosis not present

## 2012-02-25 ENCOUNTER — Encounter (HOSPITAL_COMMUNITY): Payer: Self-pay | Admitting: *Deleted

## 2012-02-25 ENCOUNTER — Encounter (HOSPITAL_COMMUNITY)
Admission: RE | Disposition: A | Discharge status: Hospice | Payer: Self-pay | Source: Ambulatory Visit | Attending: Gastroenterology

## 2012-02-25 ENCOUNTER — Ambulatory Visit (HOSPITAL_COMMUNITY)
Admission: RE | Admit: 2012-02-25 | Discharge: 2012-02-25 | Disposition: A | Discharge status: Hospice | Payer: Medicare Other | Source: Ambulatory Visit | Attending: Gastroenterology | Admitting: Gastroenterology

## 2012-02-25 DIAGNOSIS — E119 Type 2 diabetes mellitus without complications: Secondary | ICD-10-CM | POA: Insufficient documentation

## 2012-02-25 DIAGNOSIS — Z09 Encounter for follow-up examination after completed treatment for conditions other than malignant neoplasm: Secondary | ICD-10-CM | POA: Insufficient documentation

## 2012-02-25 DIAGNOSIS — G4733 Obstructive sleep apnea (adult) (pediatric): Secondary | ICD-10-CM | POA: Diagnosis not present

## 2012-02-25 DIAGNOSIS — K219 Gastro-esophageal reflux disease without esophagitis: Secondary | ICD-10-CM | POA: Diagnosis not present

## 2012-02-25 DIAGNOSIS — D129 Benign neoplasm of anus and anal canal: Secondary | ICD-10-CM | POA: Diagnosis not present

## 2012-02-25 DIAGNOSIS — K5730 Diverticulosis of large intestine without perforation or abscess without bleeding: Secondary | ICD-10-CM | POA: Insufficient documentation

## 2012-02-25 DIAGNOSIS — I10 Essential (primary) hypertension: Secondary | ICD-10-CM | POA: Diagnosis not present

## 2012-02-25 DIAGNOSIS — Z79899 Other long term (current) drug therapy: Secondary | ICD-10-CM | POA: Diagnosis not present

## 2012-02-25 DIAGNOSIS — K621 Rectal polyp: Secondary | ICD-10-CM | POA: Insufficient documentation

## 2012-02-25 DIAGNOSIS — Z7982 Long term (current) use of aspirin: Secondary | ICD-10-CM | POA: Diagnosis not present

## 2012-02-25 DIAGNOSIS — D128 Benign neoplasm of rectum: Secondary | ICD-10-CM | POA: Diagnosis not present

## 2012-02-25 DIAGNOSIS — D126 Benign neoplasm of colon, unspecified: Secondary | ICD-10-CM | POA: Insufficient documentation

## 2012-02-25 DIAGNOSIS — E785 Hyperlipidemia, unspecified: Secondary | ICD-10-CM | POA: Insufficient documentation

## 2012-02-25 DIAGNOSIS — K620 Anal polyp: Secondary | ICD-10-CM | POA: Insufficient documentation

## 2012-02-25 HISTORY — DX: Diverticulosis of intestine, part unspecified, without perforation or abscess without bleeding: K57.90

## 2012-02-25 HISTORY — DX: Other complications of anesthesia, initial encounter: T88.59XA

## 2012-02-25 HISTORY — DX: Essential (primary) hypertension: I10

## 2012-02-25 HISTORY — DX: Benign neoplasm of colon, unspecified: D12.6

## 2012-02-25 HISTORY — PX: COLONOSCOPY: SHX5424

## 2012-02-25 HISTORY — DX: Gastro-esophageal reflux disease without esophagitis: K21.9

## 2012-02-25 HISTORY — DX: Hyperlipidemia, unspecified: E78.5

## 2012-02-25 HISTORY — DX: Adverse effect of unspecified anesthetic, initial encounter: T41.45XA

## 2012-02-25 HISTORY — DX: Sleep apnea, unspecified: G47.30

## 2012-02-25 SURGERY — COLONOSCOPY
Anesthesia: Moderate Sedation

## 2012-02-25 MED ORDER — MIDAZOLAM HCL 10 MG/2ML IJ SOLN
INTRAMUSCULAR | Status: DC | PRN
  Administered 2012-02-25 (×2): 1 mg via INTRAVENOUS
  Administered 2012-02-25: 2 mg via INTRAVENOUS

## 2012-02-25 MED ORDER — MIDAZOLAM HCL 10 MG/2ML IJ SOLN
INTRAMUSCULAR | Status: AC
  Filled 2012-02-25: qty 2

## 2012-02-25 MED ORDER — FENTANYL CITRATE 0.05 MG/ML IJ SOLN
INTRAMUSCULAR | Status: AC
  Filled 2012-02-25: qty 2

## 2012-02-25 MED ORDER — FENTANYL CITRATE 0.05 MG/ML IJ SOLN
INTRAMUSCULAR | Status: DC | PRN
  Administered 2012-02-25 (×2): 25 ug via INTRAVENOUS

## 2012-02-25 MED ORDER — SODIUM CHLORIDE 0.9 % IV SOLN
Freq: Once | INTRAVENOUS | Status: AC
  Administered 2012-02-25: 500 mL via INTRAVENOUS

## 2012-02-25 NOTE — Op Note (Signed)
[facility] [address] [place], [place]  [address]  OPERATIVE PROCEDURE REPORT  PATIENT:  Margaret Rocha, Margaret Rocha  MR#:  [id number] BIRTHDATE:  07/20/1946  GENDER:  female ENDOSCOPIST:  Dr. Lorenza Burton, MD ASSISTANT:  Rolanda Lundborg and Angelique Blonder, RN.  PROCEDURE DATE:  02/25/2012 PRE-PROCEDURE PREPERATION:  The patient was prepped with 32 oz. of Suprep the night before the procedure and 32 oz. the morning of the procedure.  The patient was fasted for 4 hours prior to the procedure. C-Pap was placed in position. PRE-PROCEDURE PHYSICAL:  Patient has stable vital signs. Neck is supple. There is no JVD, thyromegaly or LAD. Chest clear to auscultation.S1 and S2 regular. Abdomen soft, morbidly obese, non-distended, non-tender with NABS. PROCEDURE:  Colonoscopy with hot snare polypectomyx 3. ASA CLASS:  Class III INDICATIONS:  1) CRC screening 2) DiverticulOSIS 3) Personal history of adenomatous colonic polyps. MEDICATIONS:  Fentanyl 50 mcg & Versed 4 mg IV.  DESCRIPTION OF PROCEDURE: After the risks, benefits, and alternatives of the procedure were thoroughly explained [including a 10% missed rate of cancer and polyps], informed consent was obtained.  Digital rectal exam was performed.  The Pentax Colonoscope [id number] was introduced through the anus and advanced to the cecum, which was identified by both the appendix and ileocecal valve, without limitations.  The quality of the prep was fair t best; viscous stool obscured some views; diminutive lesions could have been missed. Multiple washes were done. Small lesions could be missed. The instrument was then slowly withdrawn as the colon was fully examined. <<PROCEDUREIMAGES>>  FINDINGS:  There were 3 small sessile polyps, 2 in the right olon and one in the rectum that wereremoved by hot snare x 3 [15/15]. A few scattered sigmoid diverticula were noted. The rest of the entire colonic mucosa appeared healthy with a normal  vascular pattern. No masses or AVM's were noted.  The appendiceal orifice and the ICV were identified and photographed.  Retroflexed views revealed no abnormalities.  The patient tolerated the procedure without immediate complications.  The scope was then withdrawn from the patient and the procedure terminated.  IMPRESSION:  1) Scattered sigmoid diverticulosis. 2) Three small sessile polyps reoved [see description above]. 3) Otherwise, normal colonoscopy up to the cecum.  RECOMMENDATIONS:  1) Await pathology results. 2) Hold all NSAIDS for 2 weeks. 3) OP follow-up is advised on a PRN basis.  REPEAT EXAM:  In 5 years with a 5 day prep; in case the patient has any abnormal GI symptoms in the interim, she should contact the office immediately for further recommendations.  DISCHARGE INSTRUCTIONS:  Standard discharge instructions given.  ______________________________ Dr. Lorenza Burton, MD  CPT CODES: [phone]  DIAGNOSIS CODES:  211.3, 52.10, V12.72 V76.51  CC:  Philemon Kingdom, M.D.  n. eSIGNED:   Dr. Lorenza Burton at 02/25/2012 04:04 PM  Gillermo Murdoch, [phone]

## 2012-02-25 NOTE — Discharge Instructions (Signed)
 Colonoscopy Care After These instructions give you information on caring for yourself after your procedure. Your doctor may also give you more specific instructions. Call your doctor if you have any problems or questions after your procedure. HOME CARE  Take it easy for the next 24 hours.   Rest.   Walk or use warm packs on your belly (abdomen) if you have belly cramping or gas.   Do not drive for 24 hours.   You may shower.   Do not sign important papers or use machinery for 24 hours.   Drink enough fluids to keep your pee (urine) clear or pale yellow.   Resume your normal diet. Avoid heavy or fried foods.   Avoid alcohol.   Continue taking your normal medicines.   Only take medicine as told by your doctor. Do not take aspirin.  If you had growths (polyps) removed:  Do not take aspirin.   Do not drink alcohol for 7 days or as told by your doctor.   Eat a soft diet for 24 hours.  GET HELP RIGHT AWAY IF:  You have a fever.   You pass clumps of tissue (blood clots) or fill the toilet with blood.   You have belly pain that gets worse and medicine does not help.   Your belly is puffy (swollen).   You feel sick to your stomach (nauseous) or throw up (vomit).  MAKE SURE YOU:  Understand these instructions.   Will watch your condition.   Will get help right away if you are not doing well or get worse.  Document Released: 12/29/2010 Document Revised: 11/15/2011 Document Reviewed: 12/29/2010 [facility] Patient Information 2012 [place], [place].

## 2012-02-25 NOTE — H&P (Signed)
Margaret Rocha is an 66 y.o. female.   Chief Complaint: Colorectal cancer screening. HPI: Patient is here for a screening colonoscopy. Past Medical History  Diagnosis Date  . Diabetes mellitus   . Hyperlipemia   . Complication of anesthesia   . Hypertension   . Urinary incontinence   . GERD (gastroesophageal reflux disease)   . Diverticulosis   . Adenomatous colon polyp   . Sleep apnea     obstructive    Past Surgical History  Procedure Date  . Tonsillectomy   . Dilation and curettage of uterus   . Eye surgery     caract  . Carpal tunnel release   . Joint replacement     knee    History reviewed. No pertinent family history. Social History:  reports that she has never smoked. She does not have any smokeless tobacco history on file. She reports that she does not drink alcohol or use illicit drugs.  Allergies: Not on File  Medications Prior to Admission  Medication Dose Route Frequency Provider Last Rate Last Dose  . 0.9 %  sodium chloride infusion   Intravenous Once Margaret Elizabeth, MD 20 mL/hr at 02/25/12 1414 500 mL at 02/25/12 1414   Medications Prior to Admission  Medication Sig Dispense Refill  . aspirin 81 MG tablet Take 81 mg by mouth daily.      . Cyanocobalamin (VITAMIN B-12) 1000 MCG/15ML LIQD Take 15 mLs by mouth.      . hydrochlorothiazide (HYDRODIURIL) 25 MG tablet Take 25 mg by mouth daily.      . metFORMIN (GLUCOPHAGE) 500 MG tablet Take 1,000 mg by mouth daily at 3 pm.      . Omega-3 Fatty Acids (FISH OIL) 1000 MG CAPS Take 1,000 capsules by mouth.      Marland Kitchen omeprazole (PRILOSEC) 20 MG capsule Take 20 mg by mouth daily.      Marland Kitchen oxybutynin (DITROPAN-XL) 5 MG 24 hr tablet Take 5 mg by mouth daily.      . pravastatin (PRAVACHOL) 10 MG tablet Take 10 mg by mouth daily.        Results for orders placed during the hospital encounter of 02/25/12 (from the past 48 hour(s))  GLUCOSE, CAPILLARY     Status: Abnormal   Collection Time   02/25/12  2:06 PM      Component  Value Range Comment   Glucose-Capillary 139 (*) 70 - 99 (mg/dL)    No results found.  Review of Systems  Constitutional: Negative.  Negative for weight loss, malaise/fatigue and diaphoresis.  HENT: Negative.   Gastrointestinal: Positive for heartburn and melena. Negative for nausea, abdominal pain and blood in stool.  Genitourinary: Negative.   Neurological: Negative for seizures, loss of consciousness and weakness.  Psychiatric/Behavioral: Positive for depression. The patient is nervous/anxious.     Blood pressure 161/96, temperature 98.5 F (36.9 C), temperature source Oral, resp. rate 23, height 5' (1.524 m), weight 106.142 kg (234 lb), SpO2 92.00%. Physical Exam  Constitutional: She is oriented to person, place, and time. She appears well-developed. She appears distressed.  HENT:  Head: Normocephalic and atraumatic.  Eyes: EOM are normal.  Neck: Normal range of motion. Neck supple.  Cardiovascular: Normal rate and regular rhythm.   Respiratory: Effort normal and breath sounds normal.  GI: Soft. Bowel sounds are normal.  Musculoskeletal: Normal range of motion.  Neurological: She is alert and oriented to person, place, and time. She has normal reflexes.  Skin: Skin is warm and  dry.  Psychiatric: She has a normal mood and affect. Her behavior is normal. Judgment and thought content normal.     Assessment/Plan Colorectal cancer screening: Proceed with a colonoscopy at this time.  Margaret Rocha 02/25/2012, 3:16 PM

## 2012-02-26 ENCOUNTER — Encounter (HOSPITAL_COMMUNITY): Payer: Self-pay | Admitting: Gastroenterology

## 2012-05-01 DIAGNOSIS — E785 Hyperlipidemia, unspecified: Secondary | ICD-10-CM | POA: Diagnosis not present

## 2012-05-01 DIAGNOSIS — J209 Acute bronchitis, unspecified: Secondary | ICD-10-CM | POA: Diagnosis not present

## 2012-05-01 DIAGNOSIS — K219 Gastro-esophageal reflux disease without esophagitis: Secondary | ICD-10-CM | POA: Diagnosis not present

## 2012-05-01 DIAGNOSIS — E119 Type 2 diabetes mellitus without complications: Secondary | ICD-10-CM | POA: Diagnosis not present

## 2012-05-01 DIAGNOSIS — I10 Essential (primary) hypertension: Secondary | ICD-10-CM | POA: Diagnosis not present

## 2012-05-01 DIAGNOSIS — Z79899 Other long term (current) drug therapy: Secondary | ICD-10-CM | POA: Diagnosis not present

## 2012-05-14 DIAGNOSIS — E119 Type 2 diabetes mellitus without complications: Secondary | ICD-10-CM | POA: Diagnosis not present

## 2012-05-14 DIAGNOSIS — Z961 Presence of intraocular lens: Secondary | ICD-10-CM | POA: Diagnosis not present

## 2012-05-14 DIAGNOSIS — H26499 Other secondary cataract, unspecified eye: Secondary | ICD-10-CM | POA: Diagnosis not present

## 2012-05-21 DIAGNOSIS — E119 Type 2 diabetes mellitus without complications: Secondary | ICD-10-CM | POA: Diagnosis not present

## 2012-08-14 DIAGNOSIS — K219 Gastro-esophageal reflux disease without esophagitis: Secondary | ICD-10-CM | POA: Diagnosis not present

## 2012-08-14 DIAGNOSIS — Z79899 Other long term (current) drug therapy: Secondary | ICD-10-CM | POA: Diagnosis not present

## 2012-08-14 DIAGNOSIS — E119 Type 2 diabetes mellitus without complications: Secondary | ICD-10-CM | POA: Diagnosis not present

## 2012-08-14 DIAGNOSIS — I10 Essential (primary) hypertension: Secondary | ICD-10-CM | POA: Diagnosis not present

## 2012-08-14 DIAGNOSIS — F329 Major depressive disorder, single episode, unspecified: Secondary | ICD-10-CM | POA: Diagnosis not present

## 2012-08-14 DIAGNOSIS — E785 Hyperlipidemia, unspecified: Secondary | ICD-10-CM | POA: Diagnosis not present

## 2012-09-23 DIAGNOSIS — J209 Acute bronchitis, unspecified: Secondary | ICD-10-CM | POA: Diagnosis not present

## 2012-09-23 DIAGNOSIS — E119 Type 2 diabetes mellitus without complications: Secondary | ICD-10-CM | POA: Diagnosis not present

## 2012-09-23 DIAGNOSIS — R062 Wheezing: Secondary | ICD-10-CM | POA: Diagnosis not present

## 2012-09-24 DIAGNOSIS — M25569 Pain in unspecified knee: Secondary | ICD-10-CM | POA: Diagnosis not present

## 2012-09-24 DIAGNOSIS — M549 Dorsalgia, unspecified: Secondary | ICD-10-CM | POA: Diagnosis not present

## 2012-12-09 DIAGNOSIS — Z23 Encounter for immunization: Secondary | ICD-10-CM | POA: Diagnosis not present

## 2012-12-31 DIAGNOSIS — Z79899 Other long term (current) drug therapy: Secondary | ICD-10-CM | POA: Diagnosis not present

## 2012-12-31 DIAGNOSIS — J069 Acute upper respiratory infection, unspecified: Secondary | ICD-10-CM | POA: Diagnosis not present

## 2012-12-31 DIAGNOSIS — R609 Edema, unspecified: Secondary | ICD-10-CM | POA: Diagnosis not present

## 2012-12-31 DIAGNOSIS — E119 Type 2 diabetes mellitus without complications: Secondary | ICD-10-CM | POA: Diagnosis not present

## 2012-12-31 DIAGNOSIS — F329 Major depressive disorder, single episode, unspecified: Secondary | ICD-10-CM | POA: Diagnosis not present

## 2012-12-31 DIAGNOSIS — E785 Hyperlipidemia, unspecified: Secondary | ICD-10-CM | POA: Diagnosis not present

## 2013-01-14 DIAGNOSIS — E785 Hyperlipidemia, unspecified: Secondary | ICD-10-CM | POA: Diagnosis not present

## 2013-01-14 DIAGNOSIS — M159 Polyosteoarthritis, unspecified: Secondary | ICD-10-CM | POA: Diagnosis not present

## 2013-01-14 DIAGNOSIS — M549 Dorsalgia, unspecified: Secondary | ICD-10-CM | POA: Diagnosis not present

## 2013-01-14 DIAGNOSIS — I10 Essential (primary) hypertension: Secondary | ICD-10-CM | POA: Diagnosis not present

## 2013-03-16 DIAGNOSIS — E119 Type 2 diabetes mellitus without complications: Secondary | ICD-10-CM | POA: Diagnosis not present

## 2013-03-16 DIAGNOSIS — I10 Essential (primary) hypertension: Secondary | ICD-10-CM | POA: Diagnosis not present

## 2013-03-16 DIAGNOSIS — K219 Gastro-esophageal reflux disease without esophagitis: Secondary | ICD-10-CM | POA: Diagnosis not present

## 2013-03-16 DIAGNOSIS — E785 Hyperlipidemia, unspecified: Secondary | ICD-10-CM | POA: Diagnosis not present

## 2013-04-16 DIAGNOSIS — E785 Hyperlipidemia, unspecified: Secondary | ICD-10-CM | POA: Diagnosis not present

## 2013-04-16 DIAGNOSIS — Z79899 Other long term (current) drug therapy: Secondary | ICD-10-CM | POA: Diagnosis not present

## 2013-04-16 DIAGNOSIS — E119 Type 2 diabetes mellitus without complications: Secondary | ICD-10-CM | POA: Diagnosis not present

## 2013-04-16 DIAGNOSIS — I10 Essential (primary) hypertension: Secondary | ICD-10-CM | POA: Diagnosis not present

## 2013-06-17 DIAGNOSIS — G252 Other specified forms of tremor: Secondary | ICD-10-CM | POA: Diagnosis not present

## 2013-07-16 DIAGNOSIS — E785 Hyperlipidemia, unspecified: Secondary | ICD-10-CM | POA: Diagnosis not present

## 2013-07-16 DIAGNOSIS — G252 Other specified forms of tremor: Secondary | ICD-10-CM | POA: Diagnosis not present

## 2013-07-16 DIAGNOSIS — K219 Gastro-esophageal reflux disease without esophagitis: Secondary | ICD-10-CM | POA: Diagnosis not present

## 2013-07-16 DIAGNOSIS — Z79899 Other long term (current) drug therapy: Secondary | ICD-10-CM | POA: Diagnosis not present

## 2013-07-16 DIAGNOSIS — G250 Essential tremor: Secondary | ICD-10-CM | POA: Diagnosis not present

## 2013-07-16 DIAGNOSIS — E119 Type 2 diabetes mellitus without complications: Secondary | ICD-10-CM | POA: Diagnosis not present

## 2013-07-16 DIAGNOSIS — I10 Essential (primary) hypertension: Secondary | ICD-10-CM | POA: Diagnosis not present

## 2013-07-29 DIAGNOSIS — H43819 Vitreous degeneration, unspecified eye: Secondary | ICD-10-CM | POA: Diagnosis not present

## 2013-07-29 DIAGNOSIS — H04129 Dry eye syndrome of unspecified lacrimal gland: Secondary | ICD-10-CM | POA: Diagnosis not present

## 2013-07-29 DIAGNOSIS — E119 Type 2 diabetes mellitus without complications: Secondary | ICD-10-CM | POA: Diagnosis not present

## 2013-07-29 DIAGNOSIS — D3130 Benign neoplasm of unspecified choroid: Secondary | ICD-10-CM | POA: Diagnosis not present

## 2013-07-29 DIAGNOSIS — Z961 Presence of intraocular lens: Secondary | ICD-10-CM | POA: Diagnosis not present

## 2013-08-25 DIAGNOSIS — E1149 Type 2 diabetes mellitus with other diabetic neurological complication: Secondary | ICD-10-CM | POA: Diagnosis not present

## 2013-08-25 DIAGNOSIS — M79609 Pain in unspecified limb: Secondary | ICD-10-CM | POA: Diagnosis not present

## 2013-08-25 DIAGNOSIS — L600 Ingrowing nail: Secondary | ICD-10-CM | POA: Diagnosis not present

## 2013-08-25 DIAGNOSIS — B351 Tinea unguium: Secondary | ICD-10-CM | POA: Diagnosis not present

## 2013-09-24 DIAGNOSIS — Z23 Encounter for immunization: Secondary | ICD-10-CM | POA: Diagnosis not present

## 2013-10-06 ENCOUNTER — Ambulatory Visit (INDEPENDENT_AMBULATORY_CARE_PROVIDER_SITE_OTHER): Payer: Medicare Other | Admitting: Podiatrist

## 2013-10-06 ENCOUNTER — Encounter: Payer: Self-pay | Admitting: Podiatrist

## 2013-10-06 ENCOUNTER — Encounter (INDEPENDENT_AMBULATORY_CARE_PROVIDER_SITE_OTHER): Payer: Self-pay

## 2013-10-06 VITALS — BP 119/64 | HR 77 | Resp 18

## 2013-10-06 DIAGNOSIS — M898X9 Other specified disorders of bone, unspecified site: Secondary | ICD-10-CM

## 2013-10-06 DIAGNOSIS — L600 Ingrowing nail: Secondary | ICD-10-CM | POA: Diagnosis not present

## 2013-10-06 NOTE — Progress Notes (Signed)
  Subjective:    Patient ID: Margaret Rocha, female    DOB: 1946-11-24, 67 y.o.   MRN: [id number]  MHPI patient presents today complaining of painful toenails 2, 3 and 4 left greater than right. Patient also has calluses on the medial aspect of bilateral great toes of which she states are uncomfortable. Of note the patient is referring to these calluses as "bunions" and states that she wants to have these fixed in January. The patient has healed nicely from her previous ingrown toenail procedure performed on the left hallux.    Review of Systems  Constitutional: Negative.   HENT: Negative.   Eyes:       Floaters-black spots  Respiratory: Positive for apnea.   Cardiovascular: Negative.   Gastrointestinal:       IBS  Endocrine: Negative.   Genitourinary: Negative.   Musculoskeletal: Positive for back pain and neck pain.       Spine is curved  Skin: Negative.   Allergic/Immunologic: Negative.   Neurological: Negative.   Hematological: Bruises/bleeds easily.  Psychiatric/Behavioral: Negative.        Objective:   Physical Exam neurovascular status is intact and unchanged with palpable symmetric pedal pulses at 1+ out of 4 DP and PT bilateral. Neuropathic changes are noted but minimal. Excellent appearance of the left hallux from previous permanent phenol matrixectomy noted. Medial and lateral nail borders are incurvated ingrown left 2, 3 and 4 and right 2, 3 and 4.  Digits 2 and 3 left are the most symptomatic at today's visit. No pus or purulence no sign of infection no drainage or malodor noted. Hyperkeratotic lesions present medial aspect base of the distal phalanx bilateral.     Assessment & Plan:  Assessment: Ingrown left second and third toenails both borders ,  exostosis base distal  phalanx bilateral halluces Plan: Treatment options and alternatives discussed.  Recommended permanent phenol matrixectomy and patient agreed.  left toenails 2 and 3 bilateral borders  prepped with  alcohol and a 1 to 1 mix of 0.5% marcaine plain and 2% lidocaine plain was administered in a digital block fashion.  The toes were then prepped with betadine solution and exsanguinated.  The offending nail border was then excised and matrix tissue exposed.  Phenol was then applied to the matrix tissue followed by an alcohol wash.  Antibiotic ointment and a dry sterile dressing was applied.  The patient was dispensed instructions for aftercare.    We did discuss possible surgical intervention for prevention of calluses in decreasing pain bilateral halluces. Patient will be rescheduled for surgery consult and x-rays will be taken at that time.

## 2013-10-06 NOTE — Patient Instructions (Signed)
ANTIBACTERIAL SOAP INSTRUCTIONS  THE DAY AFTER PROCEDURE     Shower as usual. Before getting out, place a drop of antibacterial liquid soap (Dial) on a wet, clean washcloth.  Gently wipe washcloth over affected area.  Afterward, rinse the area with warm water.  Blot the area dry with a soft cloth and cover with antibiotic ointment (neosporin, polysporin, bacitracin) and band aid or gauze and tape  OR   Place 3-4 drops of antibacterial liquid soap in a quart of warm tap water.  Submerge foot into water for 20 minutes.  If bandage was applied after your procedure, leave on to allow for easy lift off, then remove and continue with soak for the remaining time.  Next, blot area dry with a soft cloth and cover with a bandage.  Apply other medications as directed by your doctor, such as cortisporin otic solution (eardrops) or neosporin antibiotic ointment  Call if you notice any redness, swelling or pus.

## 2013-11-16 DIAGNOSIS — Z1231 Encounter for screening mammogram for malignant neoplasm of breast: Secondary | ICD-10-CM | POA: Diagnosis not present

## 2013-11-16 DIAGNOSIS — I10 Essential (primary) hypertension: Secondary | ICD-10-CM | POA: Diagnosis not present

## 2013-11-16 DIAGNOSIS — K219 Gastro-esophageal reflux disease without esophagitis: Secondary | ICD-10-CM | POA: Diagnosis not present

## 2013-11-16 DIAGNOSIS — E785 Hyperlipidemia, unspecified: Secondary | ICD-10-CM | POA: Diagnosis not present

## 2013-11-16 DIAGNOSIS — E119 Type 2 diabetes mellitus without complications: Secondary | ICD-10-CM | POA: Diagnosis not present

## 2013-11-16 DIAGNOSIS — R413 Other amnesia: Secondary | ICD-10-CM | POA: Diagnosis not present

## 2013-11-16 DIAGNOSIS — M542 Cervicalgia: Secondary | ICD-10-CM | POA: Diagnosis not present

## 2013-11-16 DIAGNOSIS — Z79899 Other long term (current) drug therapy: Secondary | ICD-10-CM | POA: Diagnosis not present

## 2013-11-17 ENCOUNTER — Encounter: Payer: Self-pay | Admitting: Podiatrist

## 2013-11-17 ENCOUNTER — Ambulatory Visit (INDEPENDENT_AMBULATORY_CARE_PROVIDER_SITE_OTHER): Payer: Medicare Other | Admitting: Podiatrist

## 2013-11-17 VITALS — BP 133/72 | HR 83 | Resp 18

## 2013-11-17 DIAGNOSIS — L600 Ingrowing nail: Secondary | ICD-10-CM | POA: Diagnosis not present

## 2013-11-17 NOTE — Patient Instructions (Signed)
ANTIBACTERIAL SOAP INSTRUCTIONS  THE DAY AFTER PROCEDURE     Shower as usual. Before getting out, place a drop of antibacterial liquid soap (Dial) on a wet, clean washcloth.  Gently wipe washcloth over affected area.  Afterward, rinse the area with warm water.  Blot the area dry with a soft cloth and cover with antibiotic ointment (neosporin, polysporin, bacitracin) and band aid or gauze and tape  Or    Place 3-4 drops of antibacterial liquid soap in a quart of warm tap water.  Submerge foot into water for 20 minutes.  If bandage was applied after your procedure, leave on to allow for easy lift off, then remove and continue with soak for the remaining time.  Next, blot area dry with a soft cloth and cover with a bandage.  Apply other medications as directed by your doctor, such as cortisporin otic solution (eardrops) or neosporin antibiotic ointment

## 2013-11-17 NOTE — Progress Notes (Signed)
   HPI patient presents today complaining of painful toenails 2, 3  On the right foot and toe 4 left   Patient also contineus to have small bone spurs with overlying calluses on the medial aspect of bilateral great toes of which she states are uncomfortable. She states that she wants to have these fixed in January and she wants the 4th toenail on the left foot fixed at that time as well. The patient has healed nicely from her previous ingrown toenail procedure performed on the left hallux and left toes 2 and 3.   Objective:   Physical Exam neurovascular status is intact and unchanged with palpable symmetric pedal pulses bilateral. Neuropathic changes are noted but minimal. Excellent appearance of the left hallux and left 2nd and 3rd toes from previous permanent phenol matrixectomy.  Digits 2 and 3 right have ingrown medial and lateral nail borders and incurvation.  No pus or purulence no sign of infection no drainage or malodor noted. Hyperkeratotic lesions present medial aspect base of the distal phalanx bilateral.  Assessment & Plan:   Assessment: Ingrown right second and third toenails both borders , exostosis base distal phalanx bilateral halluces; ingrown 4th toenail left Plan:  Treatment options and alternatives discussed. Recommended permanent phenol matrixectomy and patient agreed. right toenails 2 and 3 bilateral borders prepped with alcohol and a 1 to 1 mix of 0.5% marcaine plain and 2% lidocaine plain was administered in a digital block fashion. The toes were then prepped with betadine solution and exsanguinated. The offending nail border was then excised and matrix tissue exposed. Phenol was then applied to the matrix tissue followed by an alcohol wash. Antibiotic ointment and a dry sterile dressing was applied. The patient was dispensed instructions for aftercare.  We did discuss possible surgical intervention for prevention of calluses in decreasing pain bilateral halluces. Patient will be  scheduled for a surgery consult and x-rays will be taken at that time.

## 2013-11-23 DIAGNOSIS — Z1231 Encounter for screening mammogram for malignant neoplasm of breast: Secondary | ICD-10-CM | POA: Diagnosis not present

## 2013-11-23 DIAGNOSIS — M47812 Spondylosis without myelopathy or radiculopathy, cervical region: Secondary | ICD-10-CM | POA: Diagnosis not present

## 2014-02-01 DIAGNOSIS — R109 Unspecified abdominal pain: Secondary | ICD-10-CM | POA: Diagnosis not present

## 2014-02-01 DIAGNOSIS — N12 Tubulo-interstitial nephritis, not specified as acute or chronic: Secondary | ICD-10-CM | POA: Diagnosis not present

## 2014-02-01 DIAGNOSIS — R110 Nausea: Secondary | ICD-10-CM | POA: Diagnosis not present

## 2014-02-01 DIAGNOSIS — N200 Calculus of kidney: Secondary | ICD-10-CM | POA: Diagnosis not present

## 2014-02-02 DIAGNOSIS — N200 Calculus of kidney: Secondary | ICD-10-CM | POA: Diagnosis not present

## 2014-02-12 DIAGNOSIS — N12 Tubulo-interstitial nephritis, not specified as acute or chronic: Secondary | ICD-10-CM | POA: Diagnosis not present

## 2014-02-12 DIAGNOSIS — M542 Cervicalgia: Secondary | ICD-10-CM | POA: Diagnosis not present

## 2014-02-12 DIAGNOSIS — N200 Calculus of kidney: Secondary | ICD-10-CM | POA: Diagnosis not present

## 2014-02-12 DIAGNOSIS — E119 Type 2 diabetes mellitus without complications: Secondary | ICD-10-CM | POA: Diagnosis not present

## 2014-02-12 DIAGNOSIS — R609 Edema, unspecified: Secondary | ICD-10-CM | POA: Diagnosis not present

## 2014-03-08 DIAGNOSIS — R109 Unspecified abdominal pain: Secondary | ICD-10-CM | POA: Diagnosis not present

## 2014-03-08 DIAGNOSIS — N200 Calculus of kidney: Secondary | ICD-10-CM | POA: Diagnosis not present

## 2014-03-10 DIAGNOSIS — I10 Essential (primary) hypertension: Secondary | ICD-10-CM | POA: Diagnosis not present

## 2014-03-10 DIAGNOSIS — J209 Acute bronchitis, unspecified: Secondary | ICD-10-CM | POA: Diagnosis not present

## 2014-03-10 DIAGNOSIS — F3289 Other specified depressive episodes: Secondary | ICD-10-CM | POA: Diagnosis not present

## 2014-03-10 DIAGNOSIS — Z79899 Other long term (current) drug therapy: Secondary | ICD-10-CM | POA: Diagnosis not present

## 2014-03-10 DIAGNOSIS — K219 Gastro-esophageal reflux disease without esophagitis: Secondary | ICD-10-CM | POA: Diagnosis not present

## 2014-03-10 DIAGNOSIS — F329 Major depressive disorder, single episode, unspecified: Secondary | ICD-10-CM | POA: Diagnosis not present

## 2014-03-10 DIAGNOSIS — R062 Wheezing: Secondary | ICD-10-CM | POA: Diagnosis not present

## 2014-03-10 DIAGNOSIS — IMO0001 Reserved for inherently not codable concepts without codable children: Secondary | ICD-10-CM | POA: Diagnosis not present

## 2014-03-18 DIAGNOSIS — K219 Gastro-esophageal reflux disease without esophagitis: Secondary | ICD-10-CM | POA: Diagnosis present

## 2014-03-18 DIAGNOSIS — E876 Hypokalemia: Secondary | ICD-10-CM | POA: Diagnosis not present

## 2014-03-18 DIAGNOSIS — E119 Type 2 diabetes mellitus without complications: Secondary | ICD-10-CM | POA: Diagnosis present

## 2014-03-18 DIAGNOSIS — Z79899 Other long term (current) drug therapy: Secondary | ICD-10-CM | POA: Diagnosis not present

## 2014-03-18 DIAGNOSIS — R0602 Shortness of breath: Secondary | ICD-10-CM | POA: Diagnosis not present

## 2014-03-18 DIAGNOSIS — R160 Hepatomegaly, not elsewhere classified: Secondary | ICD-10-CM | POA: Diagnosis not present

## 2014-03-18 DIAGNOSIS — J441 Chronic obstructive pulmonary disease with (acute) exacerbation: Secondary | ICD-10-CM | POA: Diagnosis not present

## 2014-03-18 DIAGNOSIS — I10 Essential (primary) hypertension: Secondary | ICD-10-CM | POA: Diagnosis present

## 2014-03-18 DIAGNOSIS — R109 Unspecified abdominal pain: Secondary | ICD-10-CM | POA: Diagnosis not present

## 2014-03-18 DIAGNOSIS — M1990 Unspecified osteoarthritis, unspecified site: Secondary | ICD-10-CM | POA: Diagnosis present

## 2014-03-18 DIAGNOSIS — R05 Cough: Secondary | ICD-10-CM | POA: Diagnosis not present

## 2014-03-18 DIAGNOSIS — E785 Hyperlipidemia, unspecified: Secondary | ICD-10-CM | POA: Diagnosis present

## 2014-03-18 DIAGNOSIS — M4120 Other idiopathic scoliosis, site unspecified: Secondary | ICD-10-CM | POA: Diagnosis present

## 2014-03-18 DIAGNOSIS — E662 Morbid (severe) obesity with alveolar hypoventilation: Secondary | ICD-10-CM | POA: Diagnosis not present

## 2014-03-18 DIAGNOSIS — J984 Other disorders of lung: Secondary | ICD-10-CM | POA: Diagnosis not present

## 2014-03-18 DIAGNOSIS — Z7982 Long term (current) use of aspirin: Secondary | ICD-10-CM | POA: Diagnosis not present

## 2014-03-18 DIAGNOSIS — N200 Calculus of kidney: Secondary | ICD-10-CM | POA: Diagnosis not present

## 2014-03-18 DIAGNOSIS — R059 Cough, unspecified: Secondary | ICD-10-CM | POA: Diagnosis not present

## 2014-03-18 DIAGNOSIS — J309 Allergic rhinitis, unspecified: Secondary | ICD-10-CM | POA: Diagnosis not present

## 2014-03-18 DIAGNOSIS — G4733 Obstructive sleep apnea (adult) (pediatric): Secondary | ICD-10-CM | POA: Diagnosis present

## 2014-03-18 DIAGNOSIS — D72829 Elevated white blood cell count, unspecified: Secondary | ICD-10-CM | POA: Diagnosis not present

## 2014-03-18 DIAGNOSIS — K589 Irritable bowel syndrome without diarrhea: Secondary | ICD-10-CM | POA: Diagnosis present

## 2014-03-24 DIAGNOSIS — J209 Acute bronchitis, unspecified: Secondary | ICD-10-CM | POA: Diagnosis not present

## 2014-03-24 DIAGNOSIS — IMO0001 Reserved for inherently not codable concepts without codable children: Secondary | ICD-10-CM | POA: Diagnosis not present

## 2014-03-31 DIAGNOSIS — N200 Calculus of kidney: Secondary | ICD-10-CM | POA: Diagnosis not present

## 2014-03-31 DIAGNOSIS — R109 Unspecified abdominal pain: Secondary | ICD-10-CM | POA: Diagnosis not present

## 2014-04-02 DIAGNOSIS — R935 Abnormal findings on diagnostic imaging of other abdominal regions, including retroperitoneum: Secondary | ICD-10-CM | POA: Diagnosis not present

## 2014-04-02 DIAGNOSIS — E119 Type 2 diabetes mellitus without complications: Secondary | ICD-10-CM | POA: Diagnosis not present

## 2014-04-02 DIAGNOSIS — I10 Essential (primary) hypertension: Secondary | ICD-10-CM | POA: Diagnosis not present

## 2014-04-02 DIAGNOSIS — E669 Obesity, unspecified: Secondary | ICD-10-CM | POA: Diagnosis not present

## 2014-04-02 DIAGNOSIS — G4730 Sleep apnea, unspecified: Secondary | ICD-10-CM | POA: Diagnosis not present

## 2014-04-02 DIAGNOSIS — N200 Calculus of kidney: Secondary | ICD-10-CM | POA: Diagnosis not present

## 2014-04-09 DIAGNOSIS — R109 Unspecified abdominal pain: Secondary | ICD-10-CM | POA: Diagnosis not present

## 2014-04-09 DIAGNOSIS — N200 Calculus of kidney: Secondary | ICD-10-CM | POA: Diagnosis not present

## 2014-04-14 DIAGNOSIS — E119 Type 2 diabetes mellitus without complications: Secondary | ICD-10-CM | POA: Diagnosis not present

## 2014-04-14 DIAGNOSIS — E876 Hypokalemia: Secondary | ICD-10-CM | POA: Diagnosis not present

## 2014-05-10 DIAGNOSIS — R109 Unspecified abdominal pain: Secondary | ICD-10-CM | POA: Diagnosis not present

## 2014-05-10 DIAGNOSIS — N200 Calculus of kidney: Secondary | ICD-10-CM | POA: Diagnosis not present

## 2014-05-26 DIAGNOSIS — N200 Calculus of kidney: Secondary | ICD-10-CM | POA: Diagnosis not present

## 2014-07-12 DIAGNOSIS — I10 Essential (primary) hypertension: Secondary | ICD-10-CM | POA: Diagnosis not present

## 2014-07-12 DIAGNOSIS — E785 Hyperlipidemia, unspecified: Secondary | ICD-10-CM | POA: Diagnosis not present

## 2014-07-12 DIAGNOSIS — Z79899 Other long term (current) drug therapy: Secondary | ICD-10-CM | POA: Diagnosis not present

## 2014-07-12 DIAGNOSIS — R5381 Other malaise: Secondary | ICD-10-CM | POA: Diagnosis not present

## 2014-07-12 DIAGNOSIS — IMO0001 Reserved for inherently not codable concepts without codable children: Secondary | ICD-10-CM | POA: Diagnosis not present

## 2014-07-12 DIAGNOSIS — K219 Gastro-esophageal reflux disease without esophagitis: Secondary | ICD-10-CM | POA: Diagnosis not present

## 2014-07-12 DIAGNOSIS — R5383 Other fatigue: Secondary | ICD-10-CM | POA: Diagnosis not present

## 2014-07-12 DIAGNOSIS — G4710 Hypersomnia, unspecified: Secondary | ICD-10-CM | POA: Diagnosis not present

## 2014-07-12 DIAGNOSIS — G4730 Sleep apnea, unspecified: Secondary | ICD-10-CM | POA: Diagnosis not present

## 2014-07-12 DIAGNOSIS — J45909 Unspecified asthma, uncomplicated: Secondary | ICD-10-CM | POA: Diagnosis not present

## 2014-08-11 DIAGNOSIS — H02839 Dermatochalasis of unspecified eye, unspecified eyelid: Secondary | ICD-10-CM | POA: Diagnosis not present

## 2014-08-11 DIAGNOSIS — H04129 Dry eye syndrome of unspecified lacrimal gland: Secondary | ICD-10-CM | POA: Diagnosis not present

## 2014-08-11 DIAGNOSIS — Z961 Presence of intraocular lens: Secondary | ICD-10-CM | POA: Diagnosis not present

## 2014-08-11 DIAGNOSIS — E119 Type 2 diabetes mellitus without complications: Secondary | ICD-10-CM | POA: Diagnosis not present

## 2014-08-24 ENCOUNTER — Encounter: Payer: Self-pay | Admitting: Podiatrist

## 2014-08-24 ENCOUNTER — Ambulatory Visit (INDEPENDENT_AMBULATORY_CARE_PROVIDER_SITE_OTHER): Payer: Medicare Other | Admitting: Podiatrist

## 2014-08-24 VITALS — BP 136/71 | HR 93 | Resp 18

## 2014-08-24 DIAGNOSIS — L600 Ingrowing nail: Secondary | ICD-10-CM | POA: Diagnosis not present

## 2014-08-24 NOTE — Progress Notes (Signed)
Chief Complaint  Patient presents with  . Nail Problem    i need my toenails trimmed up  . Ingrown Toenail    i have a nail that is grwoing back on my big toenail and i would like for it to be removed and the 4th toenail on the left i would like to have the corners removed as well     HPI: Patient is 68 y.o. female who presents today for painful ingrowing toenails.  She relates the left great toe has a residual spike of nail growing and the fourth toenail is bothering her too.   Physical Exam  Patient is awake, alert, and oriented x 3.  In no acute distress.  Vascular status is intact with palpable pedal pulses at 2/4 DP and PT bilateral and capillary refill time within normal limits. Neurological sensation is also intact bilaterally via Semmes Weinstein monofilament at 5/5 sites. Light touch, vibratory sensation, Achilles tendon reflex is intact. Dermatological exam reveals skin color, turger and texture as normal. No open lesions present.  Musculature intact with dorsiflexion, plantarflexion, inversion, eversion. Left fourth toenail has ingrown medial and lateral borders.  Small spike of nail is on the left hallux.  All nails are thick discolored and mycotic except the great toes where the nails have been removed.   Assessment: ingrown tonails  Plan: Treatment options and alternatives discussed.  Recommended permanent phenol matrixectomy and patient agreed.  left 4th toe was prepped with alcohol and a 1 to 1 mix of 0.5% marcaine plain and 2% lidocaine plain was administered in a digital block fashion.  The toe was then prepped with betadine solution and exsanguinated.  The offending medial and lateral nail border was then excised and matrix tissue exposed.  Phenol was then applied to the matrix tissue followed by an alcohol wash.  Antibiotic ointment and a dry sterile dressing was applied.  The patient was dispensed instructions for aftercare.

## 2014-08-24 NOTE — Patient Instructions (Signed)

## 2014-09-09 DIAGNOSIS — L02416 Cutaneous abscess of left lower limb: Secondary | ICD-10-CM | POA: Diagnosis not present

## 2014-09-18 DIAGNOSIS — Z23 Encounter for immunization: Secondary | ICD-10-CM | POA: Diagnosis not present

## 2014-10-12 DIAGNOSIS — R600 Localized edema: Secondary | ICD-10-CM | POA: Diagnosis not present

## 2014-10-12 DIAGNOSIS — Z79899 Other long term (current) drug therapy: Secondary | ICD-10-CM | POA: Diagnosis not present

## 2014-10-12 DIAGNOSIS — I10 Essential (primary) hypertension: Secondary | ICD-10-CM | POA: Diagnosis not present

## 2014-10-12 DIAGNOSIS — R0602 Shortness of breath: Secondary | ICD-10-CM | POA: Diagnosis not present

## 2014-10-12 DIAGNOSIS — R609 Edema, unspecified: Secondary | ICD-10-CM | POA: Diagnosis not present

## 2014-11-01 ENCOUNTER — Ambulatory Visit (INDEPENDENT_AMBULATORY_CARE_PROVIDER_SITE_OTHER): Payer: Medicare Other

## 2014-11-01 VITALS — BP 128/69 | HR 89 | Resp 12

## 2014-11-01 DIAGNOSIS — E1140 Type 2 diabetes mellitus with diabetic neuropathy, unspecified: Secondary | ICD-10-CM

## 2014-11-01 DIAGNOSIS — M79676 Pain in unspecified toe(s): Secondary | ICD-10-CM | POA: Diagnosis not present

## 2014-11-01 DIAGNOSIS — B351 Tinea unguium: Secondary | ICD-10-CM | POA: Diagnosis not present

## 2014-11-01 DIAGNOSIS — Q828 Other specified congenital malformations of skin: Secondary | ICD-10-CM

## 2014-11-01 NOTE — Progress Notes (Signed)
   Subjective:    Patient ID: Margaret Rocha, female    DOB: [birth year], 68 y.o.   MRN: [id number]  HPI  TOENAILS TRIM AND CHECK RT FOOT 4TH TOENAIL  NEW PROBLEM: PT STATED IS DIABETIC AND  LT BACK OF THE HEEL HAVE A WOUND POSSIBLE COMING FROM WEARING SHOES AND IS BEEN SORE FOR 2 MONTHS. FOOT IS BEEN THE SAME BUT NOT GETTING ANY BETTER AND GET AGGRAVATED BY PRESSURE. TRIED LOTION FOR DRY SKIN BUT NO HELP  Review of Systems  Cardiovascular: Positive for leg swelling.  Musculoskeletal: Positive for gait problem.  All other systems reviewed and are negative.      Objective:   Physical Exam 68 year old white female well-developed well-nourished somewhat overweight oriented 3 presents at this time with some new issues presents this time for nail care debridement of nails both feet also a new area of irritation posterior left heel posterior medial aspect of the heel has a lesion abrasion from shoes that she wore that caused irritation. There is some hemorrhage keratoses identified no signs of infection no discharge or drainage noted lower extremity objective findings as follows pedal pulses are palpable DP and PT +2 over 4 bilateral mild varicosities noted bilateral epicritic sensations intact and symmetric with some decreased sensation to the forefoot and digits and arch in [employer] testing. DTRs intact there is normal plantar response noted dermatologically skin color pigment normal hair growth absent nails thick brittle crumbly dystrophic and friable DP keratotic lesion posterior lateral and posterior medial aspect left heel secondary to shoe irritation possibly heel blister for support keratoses no active discharge or drainage no bleeding noted also is thick brittle crumbly dystrophic criptotic nails on last visit did have AP nail procedure done of the borders of medial lateral borders of the left fourth toe this time is asking about the right toe however the right toe is not painful  symptomatically no signs of infection action discharge or drainage there is however thickening yellowing discoloration and brittleness of the nails consistent with onychomycosis. Mild digital contractures are noted no open wounds or ulcers are identified       Assessment & Plan:  Assessment this time his diabetes with history peripheral neuropathy and angiopathy. There is thickening brittleness discoloration and friability of nails 1 through 5 bilateral painful mycotic dystrophic ingrowing nails are debrided. Also at this time is still keratotic lesion with hemorrhage a keratoses posterior medial left heel on debridement there is slight pinpoint bleed which is treated with lidocaine Neosporin and Band-Aid dressing. Emit some lumicain and Neosporin applied both hallux as well the keratotic lesion is debrided and will maintain some Neosporin and Band-Aid dressings daily think tight or constrictive recommend future palliative care in 3 months as needed maintain accommodative shoes at all times  Harriet Masson DPM

## 2014-11-01 NOTE — Patient Instructions (Signed)
Diabetes and Foot Care Diabetes may cause you to have problems because of poor blood supply (circulation) to your feet and legs. This may cause the skin on your feet to become thinner, break easier, and heal more slowly. Your skin may become dry, and the skin may peel and crack. You may also have nerve damage in your legs and feet causing decreased feeling in them. You may not notice minor injuries to your feet that could lead to infections or more serious problems. Taking care of your feet is one of the most important things you can do for yourself.  HOME CARE INSTRUCTIONS  Wear shoes at all times, even in the house. Do not go barefoot. Bare feet are easily injured.  Check your feet daily for blisters, cuts, and redness. If you cannot see the bottom of your feet, use a mirror or ask someone for help.  Wash your feet with warm water (do not use hot water) and mild soap. Then pat your feet and the areas between your toes until they are completely dry. Do not soak your feet as this can dry your skin.  Apply a moisturizing lotion or petroleum jelly (that does not contain alcohol and is unscented) to the skin on your feet and to dry, brittle toenails. Do not apply lotion between your toes.  Trim your toenails straight across. Do not dig under them or around the cuticle. File the edges of your nails with an emery board or nail file.  Do not cut corns or calluses or try to remove them with medicine.  Wear clean socks or stockings every day. Make sure they are not too tight. Do not wear knee-high stockings since they may decrease blood flow to your legs.  Wear shoes that fit properly and have enough cushioning. To break in new shoes, wear them for just a few hours a day. This prevents you from injuring your feet. Always look in your shoes before you put them on to be sure there are no objects inside.  Do not cross your legs. This may decrease the blood flow to your feet.  If you find a minor scrape,  cut, or break in the skin on your feet, keep it and the skin around it clean and dry. These areas may be cleansed with mild soap and water. Do not cleanse the area with peroxide, alcohol, or iodine.  When you remove an adhesive bandage, be sure not to damage the skin around it.  If you have a wound, look at it several times a day to make sure it is healing.  Do not use heating pads or hot water bottles. They may burn your skin. If you have lost feeling in your feet or legs, you may not know it is happening until it is too late.  Make sure your health care provider performs a complete foot exam at least annually or more often if you have foot problems. Report any cuts, sores, or bruises to your health care provider immediately. SEEK MEDICAL CARE IF:   You have an injury that is not healing.  You have cuts or breaks in the skin.  You have an ingrown nail.  You notice redness on your legs or feet.  You feel burning or tingling in your legs or feet.  You have pain or cramps in your legs and feet.  Your legs or feet are numb.  Your feet always feel cold. SEEK IMMEDIATE MEDICAL CARE IF:   There is increasing redness,   swelling, or pain in or around a wound.  There is a red line that goes up your leg.  Pus is coming from a wound.  You develop a fever or as directed by your health care provider.  You notice a bad smell coming from an ulcer or wound. Document Released: 11/23/2000 Document Revised: 07/29/2013 Document Reviewed: 05/05/2013 [employer] Patient Information 2015 [employer]. This information is not intended to replace advice given to you by your health care provider. Make sure you discuss any questions you have with your health care provider.  

## 2014-11-09 DIAGNOSIS — R05 Cough: Secondary | ICD-10-CM | POA: Diagnosis not present

## 2014-11-09 DIAGNOSIS — J180 Bronchopneumonia, unspecified organism: Secondary | ICD-10-CM | POA: Diagnosis not present

## 2014-11-12 DIAGNOSIS — M2010 Hallux valgus (acquired), unspecified foot: Secondary | ICD-10-CM | POA: Diagnosis not present

## 2014-11-12 DIAGNOSIS — E1165 Type 2 diabetes mellitus with hyperglycemia: Secondary | ICD-10-CM | POA: Diagnosis not present

## 2014-11-12 DIAGNOSIS — J209 Acute bronchitis, unspecified: Secondary | ICD-10-CM | POA: Diagnosis not present

## 2014-11-12 DIAGNOSIS — J9801 Acute bronchospasm: Secondary | ICD-10-CM | POA: Diagnosis not present

## 2014-12-23 DIAGNOSIS — N3090 Cystitis, unspecified without hematuria: Secondary | ICD-10-CM | POA: Diagnosis not present

## 2015-01-31 ENCOUNTER — Ambulatory Visit (INDEPENDENT_AMBULATORY_CARE_PROVIDER_SITE_OTHER): Payer: Medicare Other

## 2015-01-31 VITALS — BP 125/69 | HR 94 | Resp 18

## 2015-01-31 DIAGNOSIS — M79676 Pain in unspecified toe(s): Secondary | ICD-10-CM

## 2015-01-31 DIAGNOSIS — E1140 Type 2 diabetes mellitus with diabetic neuropathy, unspecified: Secondary | ICD-10-CM | POA: Diagnosis not present

## 2015-01-31 DIAGNOSIS — Q828 Other specified congenital malformations of skin: Secondary | ICD-10-CM | POA: Diagnosis not present

## 2015-01-31 DIAGNOSIS — B351 Tinea unguium: Secondary | ICD-10-CM

## 2015-01-31 DIAGNOSIS — L600 Ingrowing nail: Secondary | ICD-10-CM

## 2015-01-31 NOTE — Progress Notes (Signed)
   Subjective:    Patient ID: Margaret Rocha, female    DOB: 1946-01-04, 69 y.o.   MRN: [id number]  HPI trim my nails and i have a callus on the back side of my left heel and i think that the cat has scratched me on the top of my left foot    Review of Systems no new findings or systemic changes noted there is keratotic lesion posterior left heel no discharge or drainage no signs of infection     Objective:   Physical Exam Lower extremity objective findings unchanged pedal pulses palpable DP and PT +2 over 4 bilateral mild varicosities noted mild +1 edema noted neurologically epicritic and proprioceptive sensations decreased to the forefoot digits and arch in [employer] testing. There is normal plantar response DTRs not elicited dermatologically skin color pigment normal hair growth absent nails thick brittle crumbly friable discolored and brittle 1 through 5 bilateral. There is also patient did have AP nail procedure done on her left fourth toe at this time and time of her right hallux being ingrown in the past as well. No active infection is no discharge or drainage there is a hemorrhage a keratosis posterior heel left. Patient got is irritation from wearing a pair of clogs for 1 day       Assessment & Plan:  Assessment this time his diabetes with history peripheral neuropathy and angiopathy. Patient's thick brittle crumbly dystrophic frontal mycotic nails debrided 9 return for future palliative care every 3 months as recommended still keratotic lesions debrided posterior left heel is also dressed with lumicain Neosporin and Band-Aid dressing maintain Neosporin and Band-Aid dressing for several days until resolved or he'll avoid any tight or slip on type shoes in the interim.  Harriet Masson DPM

## 2015-02-10 DIAGNOSIS — R194 Change in bowel habit: Secondary | ICD-10-CM | POA: Diagnosis not present

## 2015-02-10 DIAGNOSIS — K5730 Diverticulosis of large intestine without perforation or abscess without bleeding: Secondary | ICD-10-CM | POA: Diagnosis not present

## 2015-02-10 DIAGNOSIS — Z86010 Personal history of colonic polyps: Secondary | ICD-10-CM | POA: Diagnosis not present

## 2015-02-10 DIAGNOSIS — R1012 Left upper quadrant pain: Secondary | ICD-10-CM | POA: Diagnosis not present

## 2015-02-11 DIAGNOSIS — N3090 Cystitis, unspecified without hematuria: Secondary | ICD-10-CM | POA: Diagnosis not present

## 2015-02-11 DIAGNOSIS — N200 Calculus of kidney: Secondary | ICD-10-CM | POA: Diagnosis not present

## 2015-02-11 DIAGNOSIS — R109 Unspecified abdominal pain: Secondary | ICD-10-CM | POA: Diagnosis not present

## 2015-02-11 DIAGNOSIS — N390 Urinary tract infection, site not specified: Secondary | ICD-10-CM | POA: Diagnosis not present

## 2015-02-18 DIAGNOSIS — N200 Calculus of kidney: Secondary | ICD-10-CM | POA: Diagnosis not present

## 2015-02-25 DIAGNOSIS — M62838 Other muscle spasm: Secondary | ICD-10-CM | POA: Diagnosis not present

## 2015-02-25 DIAGNOSIS — R21 Rash and other nonspecific skin eruption: Secondary | ICD-10-CM | POA: Diagnosis not present

## 2015-02-25 DIAGNOSIS — Z79899 Other long term (current) drug therapy: Secondary | ICD-10-CM | POA: Diagnosis not present

## 2015-02-25 DIAGNOSIS — M545 Low back pain: Secondary | ICD-10-CM | POA: Diagnosis not present

## 2015-02-25 DIAGNOSIS — R0602 Shortness of breath: Secondary | ICD-10-CM | POA: Diagnosis not present

## 2015-02-25 DIAGNOSIS — E1165 Type 2 diabetes mellitus with hyperglycemia: Secondary | ICD-10-CM | POA: Diagnosis not present

## 2015-02-25 DIAGNOSIS — R609 Edema, unspecified: Secondary | ICD-10-CM | POA: Diagnosis not present

## 2015-02-25 DIAGNOSIS — Z87440 Personal history of urinary (tract) infections: Secondary | ICD-10-CM | POA: Diagnosis not present

## 2015-03-10 DIAGNOSIS — M5136 Other intervertebral disc degeneration, lumbar region: Secondary | ICD-10-CM | POA: Diagnosis not present

## 2015-03-10 DIAGNOSIS — M419 Scoliosis, unspecified: Secondary | ICD-10-CM | POA: Diagnosis not present

## 2015-03-10 DIAGNOSIS — M47896 Other spondylosis, lumbar region: Secondary | ICD-10-CM | POA: Diagnosis not present

## 2015-03-10 DIAGNOSIS — M4316 Spondylolisthesis, lumbar region: Secondary | ICD-10-CM | POA: Diagnosis not present

## 2015-03-10 DIAGNOSIS — M47816 Spondylosis without myelopathy or radiculopathy, lumbar region: Secondary | ICD-10-CM | POA: Diagnosis not present

## 2015-03-10 DIAGNOSIS — M545 Low back pain: Secondary | ICD-10-CM | POA: Diagnosis not present

## 2015-04-05 DIAGNOSIS — N390 Urinary tract infection, site not specified: Secondary | ICD-10-CM | POA: Diagnosis not present

## 2015-04-12 DIAGNOSIS — I10 Essential (primary) hypertension: Secondary | ICD-10-CM | POA: Diagnosis not present

## 2015-04-12 DIAGNOSIS — M47816 Spondylosis without myelopathy or radiculopathy, lumbar region: Secondary | ICD-10-CM | POA: Diagnosis not present

## 2015-04-12 DIAGNOSIS — E1165 Type 2 diabetes mellitus with hyperglycemia: Secondary | ICD-10-CM | POA: Diagnosis not present

## 2015-04-12 DIAGNOSIS — Z87440 Personal history of urinary (tract) infections: Secondary | ICD-10-CM | POA: Diagnosis not present

## 2015-04-12 DIAGNOSIS — E785 Hyperlipidemia, unspecified: Secondary | ICD-10-CM | POA: Diagnosis not present

## 2015-04-29 ENCOUNTER — Ambulatory Visit (INDEPENDENT_AMBULATORY_CARE_PROVIDER_SITE_OTHER): Payer: Medicare Other | Admitting: Podiatrist

## 2015-04-29 ENCOUNTER — Encounter: Payer: Self-pay | Admitting: Podiatrist

## 2015-04-29 VITALS — BP 138/74 | HR 97 | Resp 18

## 2015-04-29 DIAGNOSIS — B351 Tinea unguium: Secondary | ICD-10-CM

## 2015-04-29 DIAGNOSIS — M79676 Pain in unspecified toe(s): Secondary | ICD-10-CM

## 2015-04-29 DIAGNOSIS — E1140 Type 2 diabetes mellitus with diabetic neuropathy, unspecified: Secondary | ICD-10-CM

## 2015-04-29 NOTE — Progress Notes (Signed)
Chief Complaint  Patient presents with  . Nail Problem    TRIM MY NAILS      HPI: Patient is 69 y.o. female who presents today for painful ingrowing toenails.   Physical Exam  Patient is awake, alert, and oriented x 3.  In no acute distress.  Vascular status is intact with palpable pedal pulses at 2/4 DP and PT bilateral and capillary refill time within normal limits. Neurological sensation is also intact bilaterally via Semmes Weinstein monofilament at 5/5 sites. Light touch, vibratory sensation, Achilles tendon reflex is intact. Dermatological exam reveals skin color, turger and texture as normal. No open lesions present.  Musculature intact with dorsiflexion, plantarflexion, inversion, eversion. All nails are thick discolored and mycotic except the great toes where the nails have been removed.   Assessment: ingrown tonails, mycotic nails  Plan: Treatment options and alternatives discussed. Debridement of toenails accomplished today without complication.  She will be seen back in the future for pallative foot care in 3 months

## 2015-05-02 ENCOUNTER — Ambulatory Visit: Payer: Medicare Other

## 2015-06-01 DIAGNOSIS — N3080 Other cystitis without hematuria: Secondary | ICD-10-CM | POA: Diagnosis not present

## 2015-06-01 DIAGNOSIS — N3090 Cystitis, unspecified without hematuria: Secondary | ICD-10-CM | POA: Diagnosis not present

## 2015-07-13 DIAGNOSIS — E785 Hyperlipidemia, unspecified: Secondary | ICD-10-CM | POA: Diagnosis not present

## 2015-07-13 DIAGNOSIS — E1165 Type 2 diabetes mellitus with hyperglycemia: Secondary | ICD-10-CM | POA: Diagnosis not present

## 2015-07-13 DIAGNOSIS — Z79899 Other long term (current) drug therapy: Secondary | ICD-10-CM | POA: Diagnosis not present

## 2015-07-13 DIAGNOSIS — R229 Localized swelling, mass and lump, unspecified: Secondary | ICD-10-CM | POA: Diagnosis not present

## 2015-07-13 DIAGNOSIS — I10 Essential (primary) hypertension: Secondary | ICD-10-CM | POA: Diagnosis not present

## 2015-07-13 DIAGNOSIS — N390 Urinary tract infection, site not specified: Secondary | ICD-10-CM | POA: Diagnosis not present

## 2015-07-19 DIAGNOSIS — D1723 Benign lipomatous neoplasm of skin and subcutaneous tissue of right leg: Secondary | ICD-10-CM | POA: Diagnosis not present

## 2015-07-19 DIAGNOSIS — Z6841 Body Mass Index (BMI) 40.0 and over, adult: Secondary | ICD-10-CM | POA: Diagnosis not present

## 2015-07-25 ENCOUNTER — Ambulatory Visit (INDEPENDENT_AMBULATORY_CARE_PROVIDER_SITE_OTHER): Payer: Medicare Other | Admitting: Podiatry

## 2015-07-25 ENCOUNTER — Encounter: Payer: Self-pay | Admitting: Podiatry

## 2015-07-25 DIAGNOSIS — M79676 Pain in unspecified toe(s): Secondary | ICD-10-CM

## 2015-07-25 DIAGNOSIS — E1140 Type 2 diabetes mellitus with diabetic neuropathy, unspecified: Secondary | ICD-10-CM | POA: Diagnosis not present

## 2015-07-25 DIAGNOSIS — B351 Tinea unguium: Secondary | ICD-10-CM | POA: Diagnosis not present

## 2015-07-25 NOTE — Progress Notes (Signed)
Patient ID: Margaret Rocha, female   DOB: 1946/10/13, 69 y.o.   MRN: [id number] Complaint:  Visit Type: Patient returns to my office for continued preventative foot care services. Complaint: Patient states" my nails have grown long and thick and become painful to walk and wear shoes" . The patient presents for preventative foot care services. No changes to ROS  Podiatric Exam: Vascular: dorsalis pedis and posterior tibial pulses are palpable bilateral. Capillary return is immediate. Temperature gradient is WNL. Skin turgor WNL  Sensorium: Normal Semmes Weinstein monofilament test. Normal tactile sensation bilaterally. Nail Exam: Pt has thick disfigured discolored nails with subungual debris noted bilateral entire nail hallux through fifth toenails except left hallux which has one solitary nail spicule. Ulcer Exam: There is no evidence of ulcer or pre-ulcerative changes or infection. Orthopedic Exam: Muscle tone and strength are WNL. No limitations in general ROM. No crepitus or effusions noted. Foot type and digits show no abnormalities. Bony prominences are unremarkable. Skin: No Porokeratosis. No infection or ulcers  Diagnosis:  Onychomycosis, , Pain in right toe, pain in left toes  Treatment & Plan Procedures and Treatment: Consent by patient was obtained for treatment procedures. The patient understood the discussion of treatment and procedures well. All questions were answered thoroughly reviewed. Debridement of mycotic and hypertrophic toenails, 1 through 5 bilateral and clearing of subungual debris. No ulceration, no infection noted.  Return Visit-Office Procedure: Patient instructed to return to the office for a follow up visit 3 months for continued evaluation and treatment.

## 2015-08-01 ENCOUNTER — Other Ambulatory Visit: Payer: Self-pay

## 2015-08-01 ENCOUNTER — Ambulatory Visit: Payer: Medicare Other | Admitting: Podiatrist

## 2015-08-01 DIAGNOSIS — D1723 Benign lipomatous neoplasm of skin and subcutaneous tissue of right leg: Secondary | ICD-10-CM | POA: Diagnosis not present

## 2015-08-16 DIAGNOSIS — N3090 Cystitis, unspecified without hematuria: Secondary | ICD-10-CM | POA: Diagnosis not present

## 2015-08-16 DIAGNOSIS — N200 Calculus of kidney: Secondary | ICD-10-CM | POA: Diagnosis not present

## 2015-08-16 DIAGNOSIS — R1031 Right lower quadrant pain: Secondary | ICD-10-CM | POA: Diagnosis not present

## 2015-08-16 DIAGNOSIS — N3080 Other cystitis without hematuria: Secondary | ICD-10-CM | POA: Diagnosis not present

## 2015-08-29 DIAGNOSIS — Z23 Encounter for immunization: Secondary | ICD-10-CM | POA: Diagnosis not present

## 2015-09-01 DIAGNOSIS — N200 Calculus of kidney: Secondary | ICD-10-CM | POA: Diagnosis not present

## 2015-09-06 DIAGNOSIS — Z1231 Encounter for screening mammogram for malignant neoplasm of breast: Secondary | ICD-10-CM | POA: Diagnosis not present

## 2015-09-19 DIAGNOSIS — N200 Calculus of kidney: Secondary | ICD-10-CM | POA: Diagnosis not present

## 2015-09-19 DIAGNOSIS — N3090 Cystitis, unspecified without hematuria: Secondary | ICD-10-CM | POA: Diagnosis not present

## 2015-10-24 ENCOUNTER — Ambulatory Visit: Payer: Medicare Other | Admitting: Podiatry

## 2015-11-23 DIAGNOSIS — E1165 Type 2 diabetes mellitus with hyperglycemia: Secondary | ICD-10-CM | POA: Diagnosis not present

## 2015-11-23 DIAGNOSIS — H00014 Hordeolum externum left upper eyelid: Secondary | ICD-10-CM | POA: Diagnosis not present

## 2015-11-23 DIAGNOSIS — Z79899 Other long term (current) drug therapy: Secondary | ICD-10-CM | POA: Diagnosis not present

## 2015-11-23 DIAGNOSIS — N390 Urinary tract infection, site not specified: Secondary | ICD-10-CM | POA: Diagnosis not present

## 2015-11-23 DIAGNOSIS — I10 Essential (primary) hypertension: Secondary | ICD-10-CM | POA: Diagnosis not present

## 2015-11-23 DIAGNOSIS — E785 Hyperlipidemia, unspecified: Secondary | ICD-10-CM | POA: Diagnosis not present

## 2015-12-13 DIAGNOSIS — E1165 Type 2 diabetes mellitus with hyperglycemia: Secondary | ICD-10-CM | POA: Diagnosis not present

## 2015-12-13 DIAGNOSIS — I10 Essential (primary) hypertension: Secondary | ICD-10-CM | POA: Diagnosis not present

## 2015-12-13 DIAGNOSIS — G4733 Obstructive sleep apnea (adult) (pediatric): Secondary | ICD-10-CM | POA: Diagnosis not present

## 2015-12-13 DIAGNOSIS — E785 Hyperlipidemia, unspecified: Secondary | ICD-10-CM | POA: Diagnosis not present

## 2016-01-17 DIAGNOSIS — I10 Essential (primary) hypertension: Secondary | ICD-10-CM | POA: Diagnosis not present

## 2016-01-17 DIAGNOSIS — M7989 Other specified soft tissue disorders: Secondary | ICD-10-CM | POA: Diagnosis not present

## 2016-01-17 DIAGNOSIS — R229 Localized swelling, mass and lump, unspecified: Secondary | ICD-10-CM | POA: Diagnosis not present

## 2016-01-17 DIAGNOSIS — N390 Urinary tract infection, site not specified: Secondary | ICD-10-CM | POA: Diagnosis not present

## 2016-02-13 DIAGNOSIS — N3090 Cystitis, unspecified without hematuria: Secondary | ICD-10-CM | POA: Diagnosis not present

## 2016-02-13 DIAGNOSIS — N200 Calculus of kidney: Secondary | ICD-10-CM | POA: Diagnosis not present

## 2016-02-13 DIAGNOSIS — R1032 Left lower quadrant pain: Secondary | ICD-10-CM | POA: Diagnosis not present

## 2016-03-01 DIAGNOSIS — E1165 Type 2 diabetes mellitus with hyperglycemia: Secondary | ICD-10-CM | POA: Diagnosis not present

## 2016-03-01 DIAGNOSIS — I10 Essential (primary) hypertension: Secondary | ICD-10-CM | POA: Diagnosis not present

## 2016-03-01 DIAGNOSIS — N390 Urinary tract infection, site not specified: Secondary | ICD-10-CM | POA: Diagnosis not present

## 2016-03-01 DIAGNOSIS — E785 Hyperlipidemia, unspecified: Secondary | ICD-10-CM | POA: Diagnosis not present

## 2016-05-17 DIAGNOSIS — H353131 Nonexudative age-related macular degeneration, bilateral, early dry stage: Secondary | ICD-10-CM | POA: Diagnosis not present

## 2016-05-17 DIAGNOSIS — E119 Type 2 diabetes mellitus without complications: Secondary | ICD-10-CM | POA: Diagnosis not present

## 2016-05-17 DIAGNOSIS — Z961 Presence of intraocular lens: Secondary | ICD-10-CM | POA: Diagnosis not present

## 2016-06-05 DIAGNOSIS — E785 Hyperlipidemia, unspecified: Secondary | ICD-10-CM | POA: Diagnosis not present

## 2016-06-05 DIAGNOSIS — I10 Essential (primary) hypertension: Secondary | ICD-10-CM | POA: Diagnosis not present

## 2016-06-05 DIAGNOSIS — E1165 Type 2 diabetes mellitus with hyperglycemia: Secondary | ICD-10-CM | POA: Diagnosis not present

## 2016-06-05 DIAGNOSIS — G4733 Obstructive sleep apnea (adult) (pediatric): Secondary | ICD-10-CM | POA: Diagnosis not present

## 2016-06-05 DIAGNOSIS — Z79899 Other long term (current) drug therapy: Secondary | ICD-10-CM | POA: Diagnosis not present

## 2016-06-05 DIAGNOSIS — N390 Urinary tract infection, site not specified: Secondary | ICD-10-CM | POA: Diagnosis not present

## 2016-08-05 DIAGNOSIS — N200 Calculus of kidney: Secondary | ICD-10-CM | POA: Diagnosis not present

## 2016-08-05 DIAGNOSIS — N3001 Acute cystitis with hematuria: Secondary | ICD-10-CM | POA: Diagnosis not present

## 2016-08-11 DIAGNOSIS — N200 Calculus of kidney: Secondary | ICD-10-CM | POA: Diagnosis not present

## 2016-08-11 DIAGNOSIS — R1012 Left upper quadrant pain: Secondary | ICD-10-CM | POA: Diagnosis not present

## 2016-08-11 DIAGNOSIS — K5730 Diverticulosis of large intestine without perforation or abscess without bleeding: Secondary | ICD-10-CM | POA: Diagnosis not present

## 2016-08-11 DIAGNOSIS — R1032 Left lower quadrant pain: Secondary | ICD-10-CM | POA: Diagnosis not present

## 2016-08-17 DIAGNOSIS — N3090 Cystitis, unspecified without hematuria: Secondary | ICD-10-CM | POA: Diagnosis not present

## 2016-08-17 DIAGNOSIS — N200 Calculus of kidney: Secondary | ICD-10-CM | POA: Diagnosis not present

## 2016-08-24 DIAGNOSIS — N200 Calculus of kidney: Secondary | ICD-10-CM | POA: Diagnosis not present

## 2016-08-31 DIAGNOSIS — E669 Obesity, unspecified: Secondary | ICD-10-CM | POA: Diagnosis not present

## 2016-08-31 DIAGNOSIS — Z79899 Other long term (current) drug therapy: Secondary | ICD-10-CM | POA: Diagnosis not present

## 2016-08-31 DIAGNOSIS — Z7984 Long term (current) use of oral hypoglycemic drugs: Secondary | ICD-10-CM | POA: Diagnosis not present

## 2016-08-31 DIAGNOSIS — G4730 Sleep apnea, unspecified: Secondary | ICD-10-CM | POA: Diagnosis not present

## 2016-08-31 DIAGNOSIS — E119 Type 2 diabetes mellitus without complications: Secondary | ICD-10-CM | POA: Diagnosis not present

## 2016-08-31 DIAGNOSIS — N200 Calculus of kidney: Secondary | ICD-10-CM | POA: Diagnosis not present

## 2016-08-31 DIAGNOSIS — I10 Essential (primary) hypertension: Secondary | ICD-10-CM | POA: Diagnosis not present

## 2016-09-07 DIAGNOSIS — R1032 Left lower quadrant pain: Secondary | ICD-10-CM | POA: Diagnosis not present

## 2016-09-07 DIAGNOSIS — N200 Calculus of kidney: Secondary | ICD-10-CM | POA: Diagnosis not present

## 2016-10-02 DIAGNOSIS — E785 Hyperlipidemia, unspecified: Secondary | ICD-10-CM | POA: Diagnosis not present

## 2016-10-02 DIAGNOSIS — I10 Essential (primary) hypertension: Secondary | ICD-10-CM | POA: Diagnosis not present

## 2016-10-02 DIAGNOSIS — Z79899 Other long term (current) drug therapy: Secondary | ICD-10-CM | POA: Diagnosis not present

## 2016-10-02 DIAGNOSIS — N200 Calculus of kidney: Secondary | ICD-10-CM | POA: Diagnosis not present

## 2016-10-02 DIAGNOSIS — E119 Type 2 diabetes mellitus without complications: Secondary | ICD-10-CM | POA: Diagnosis not present

## 2016-10-03 DIAGNOSIS — Z23 Encounter for immunization: Secondary | ICD-10-CM | POA: Diagnosis not present

## 2016-10-22 DIAGNOSIS — N200 Calculus of kidney: Secondary | ICD-10-CM | POA: Diagnosis not present

## 2016-10-23 DIAGNOSIS — N390 Urinary tract infection, site not specified: Secondary | ICD-10-CM | POA: Diagnosis not present

## 2016-10-23 DIAGNOSIS — N200 Calculus of kidney: Secondary | ICD-10-CM | POA: Diagnosis not present

## 2016-10-23 DIAGNOSIS — R109 Unspecified abdominal pain: Secondary | ICD-10-CM | POA: Diagnosis not present

## 2016-10-23 DIAGNOSIS — N3090 Cystitis, unspecified without hematuria: Secondary | ICD-10-CM | POA: Diagnosis not present

## 2016-11-01 DIAGNOSIS — M5489 Other dorsalgia: Secondary | ICD-10-CM | POA: Diagnosis not present

## 2016-11-01 DIAGNOSIS — R319 Hematuria, unspecified: Secondary | ICD-10-CM | POA: Diagnosis not present

## 2016-11-01 DIAGNOSIS — S63502A Unspecified sprain of left wrist, initial encounter: Secondary | ICD-10-CM | POA: Diagnosis not present

## 2016-11-01 DIAGNOSIS — M25562 Pain in left knee: Secondary | ICD-10-CM | POA: Diagnosis not present

## 2016-11-01 DIAGNOSIS — S3992XA Unspecified injury of lower back, initial encounter: Secondary | ICD-10-CM | POA: Diagnosis not present

## 2016-11-01 DIAGNOSIS — S335XXA Sprain of ligaments of lumbar spine, initial encounter: Secondary | ICD-10-CM | POA: Diagnosis not present

## 2016-11-01 DIAGNOSIS — N200 Calculus of kidney: Secondary | ICD-10-CM | POA: Diagnosis not present

## 2016-11-01 DIAGNOSIS — R52 Pain, unspecified: Secondary | ICD-10-CM | POA: Diagnosis not present

## 2016-11-01 DIAGNOSIS — D72829 Elevated white blood cell count, unspecified: Secondary | ICD-10-CM | POA: Diagnosis not present

## 2016-11-01 DIAGNOSIS — S6992XA Unspecified injury of left wrist, hand and finger(s), initial encounter: Secondary | ICD-10-CM | POA: Diagnosis not present

## 2016-11-01 DIAGNOSIS — S39012A Strain of muscle, fascia and tendon of lower back, initial encounter: Secondary | ICD-10-CM | POA: Diagnosis not present

## 2016-11-07 DIAGNOSIS — M25532 Pain in left wrist: Secondary | ICD-10-CM | POA: Diagnosis not present

## 2016-11-07 DIAGNOSIS — M47816 Spondylosis without myelopathy or radiculopathy, lumbar region: Secondary | ICD-10-CM | POA: Diagnosis not present

## 2017-01-11 DIAGNOSIS — R109 Unspecified abdominal pain: Secondary | ICD-10-CM | POA: Diagnosis not present

## 2017-01-11 DIAGNOSIS — N200 Calculus of kidney: Secondary | ICD-10-CM | POA: Diagnosis not present

## 2017-01-31 DIAGNOSIS — E785 Hyperlipidemia, unspecified: Secondary | ICD-10-CM | POA: Diagnosis not present

## 2017-01-31 DIAGNOSIS — Z79899 Other long term (current) drug therapy: Secondary | ICD-10-CM | POA: Diagnosis not present

## 2017-01-31 DIAGNOSIS — K219 Gastro-esophageal reflux disease without esophagitis: Secondary | ICD-10-CM | POA: Diagnosis not present

## 2017-01-31 DIAGNOSIS — E1165 Type 2 diabetes mellitus with hyperglycemia: Secondary | ICD-10-CM | POA: Diagnosis not present

## 2017-01-31 DIAGNOSIS — I10 Essential (primary) hypertension: Secondary | ICD-10-CM | POA: Diagnosis not present

## 2017-02-06 DIAGNOSIS — J40 Bronchitis, not specified as acute or chronic: Secondary | ICD-10-CM | POA: Diagnosis not present

## 2017-02-06 DIAGNOSIS — E119 Type 2 diabetes mellitus without complications: Secondary | ICD-10-CM | POA: Diagnosis not present

## 2017-04-09 DIAGNOSIS — N390 Urinary tract infection, site not specified: Secondary | ICD-10-CM | POA: Diagnosis not present

## 2017-04-09 DIAGNOSIS — E876 Hypokalemia: Secondary | ICD-10-CM | POA: Diagnosis not present

## 2017-07-16 DIAGNOSIS — I10 Essential (primary) hypertension: Secondary | ICD-10-CM | POA: Diagnosis not present

## 2017-07-16 DIAGNOSIS — E785 Hyperlipidemia, unspecified: Secondary | ICD-10-CM | POA: Diagnosis not present

## 2017-07-16 DIAGNOSIS — Z79899 Other long term (current) drug therapy: Secondary | ICD-10-CM | POA: Diagnosis not present

## 2017-07-16 DIAGNOSIS — E1165 Type 2 diabetes mellitus with hyperglycemia: Secondary | ICD-10-CM | POA: Diagnosis not present

## 2017-07-16 DIAGNOSIS — G4733 Obstructive sleep apnea (adult) (pediatric): Secondary | ICD-10-CM | POA: Diagnosis not present

## 2017-07-16 DIAGNOSIS — R221 Localized swelling, mass and lump, neck: Secondary | ICD-10-CM | POA: Diagnosis not present

## 2017-07-16 DIAGNOSIS — M47816 Spondylosis without myelopathy or radiculopathy, lumbar region: Secondary | ICD-10-CM | POA: Diagnosis not present

## 2017-07-16 DIAGNOSIS — Z6841 Body Mass Index (BMI) 40.0 and over, adult: Secondary | ICD-10-CM | POA: Diagnosis not present

## 2017-07-23 DIAGNOSIS — E041 Nontoxic single thyroid nodule: Secondary | ICD-10-CM | POA: Diagnosis not present

## 2017-07-23 DIAGNOSIS — R221 Localized swelling, mass and lump, neck: Secondary | ICD-10-CM | POA: Diagnosis not present

## 2017-07-25 DIAGNOSIS — N3090 Cystitis, unspecified without hematuria: Secondary | ICD-10-CM | POA: Diagnosis not present

## 2017-07-25 DIAGNOSIS — R8290 Unspecified abnormal findings in urine: Secondary | ICD-10-CM | POA: Diagnosis not present

## 2017-07-25 DIAGNOSIS — N952 Postmenopausal atrophic vaginitis: Secondary | ICD-10-CM | POA: Diagnosis not present

## 2017-07-25 DIAGNOSIS — N3946 Mixed incontinence: Secondary | ICD-10-CM | POA: Diagnosis not present

## 2017-07-25 DIAGNOSIS — N200 Calculus of kidney: Secondary | ICD-10-CM | POA: Diagnosis not present

## 2017-07-25 DIAGNOSIS — N390 Urinary tract infection, site not specified: Secondary | ICD-10-CM | POA: Diagnosis not present

## 2017-07-26 DIAGNOSIS — N200 Calculus of kidney: Secondary | ICD-10-CM | POA: Diagnosis not present

## 2017-07-30 DIAGNOSIS — Z1211 Encounter for screening for malignant neoplasm of colon: Secondary | ICD-10-CM | POA: Diagnosis not present

## 2017-07-30 DIAGNOSIS — K219 Gastro-esophageal reflux disease without esophagitis: Secondary | ICD-10-CM | POA: Diagnosis not present

## 2017-07-30 DIAGNOSIS — K5730 Diverticulosis of large intestine without perforation or abscess without bleeding: Secondary | ICD-10-CM | POA: Diagnosis not present

## 2017-07-30 DIAGNOSIS — Z86010 Personal history of colonic polyps: Secondary | ICD-10-CM | POA: Diagnosis not present

## 2017-08-05 DIAGNOSIS — R109 Unspecified abdominal pain: Secondary | ICD-10-CM | POA: Diagnosis not present

## 2017-08-05 DIAGNOSIS — N3090 Cystitis, unspecified without hematuria: Secondary | ICD-10-CM | POA: Diagnosis not present

## 2017-08-05 DIAGNOSIS — N200 Calculus of kidney: Secondary | ICD-10-CM | POA: Diagnosis not present

## 2017-09-13 DIAGNOSIS — K5140 Inflammatory polyps of colon without complications: Secondary | ICD-10-CM | POA: Diagnosis not present

## 2017-09-13 DIAGNOSIS — Z1211 Encounter for screening for malignant neoplasm of colon: Secondary | ICD-10-CM | POA: Diagnosis not present

## 2017-09-13 DIAGNOSIS — K6389 Other specified diseases of intestine: Secondary | ICD-10-CM | POA: Diagnosis not present

## 2017-09-13 DIAGNOSIS — D123 Benign neoplasm of transverse colon: Secondary | ICD-10-CM | POA: Diagnosis not present

## 2017-09-13 DIAGNOSIS — D122 Benign neoplasm of ascending colon: Secondary | ICD-10-CM | POA: Diagnosis not present

## 2017-09-13 DIAGNOSIS — K635 Polyp of colon: Secondary | ICD-10-CM | POA: Diagnosis not present

## 2017-09-13 DIAGNOSIS — D125 Benign neoplasm of sigmoid colon: Secondary | ICD-10-CM | POA: Diagnosis not present

## 2017-10-03 DIAGNOSIS — Z23 Encounter for immunization: Secondary | ICD-10-CM | POA: Diagnosis not present

## 2017-11-21 DIAGNOSIS — Z6841 Body Mass Index (BMI) 40.0 and over, adult: Secondary | ICD-10-CM | POA: Diagnosis not present

## 2017-11-21 DIAGNOSIS — E1165 Type 2 diabetes mellitus with hyperglycemia: Secondary | ICD-10-CM | POA: Diagnosis not present

## 2017-11-21 DIAGNOSIS — G4733 Obstructive sleep apnea (adult) (pediatric): Secondary | ICD-10-CM | POA: Diagnosis not present

## 2017-11-21 DIAGNOSIS — I10 Essential (primary) hypertension: Secondary | ICD-10-CM | POA: Diagnosis not present

## 2017-11-21 DIAGNOSIS — E785 Hyperlipidemia, unspecified: Secondary | ICD-10-CM | POA: Diagnosis not present

## 2017-11-21 DIAGNOSIS — Z87442 Personal history of urinary calculi: Secondary | ICD-10-CM | POA: Diagnosis not present

## 2017-11-26 DIAGNOSIS — E785 Hyperlipidemia, unspecified: Secondary | ICD-10-CM | POA: Diagnosis not present

## 2017-11-26 DIAGNOSIS — I10 Essential (primary) hypertension: Secondary | ICD-10-CM | POA: Diagnosis not present

## 2017-11-26 DIAGNOSIS — E1165 Type 2 diabetes mellitus with hyperglycemia: Secondary | ICD-10-CM | POA: Diagnosis not present

## 2017-11-26 DIAGNOSIS — Z79899 Other long term (current) drug therapy: Secondary | ICD-10-CM | POA: Diagnosis not present

## 2018-01-02 DIAGNOSIS — S4992XA Unspecified injury of left shoulder and upper arm, initial encounter: Secondary | ICD-10-CM | POA: Diagnosis not present

## 2018-01-02 DIAGNOSIS — M25562 Pain in left knee: Secondary | ICD-10-CM | POA: Diagnosis not present

## 2018-01-02 DIAGNOSIS — S8992XA Unspecified injury of left lower leg, initial encounter: Secondary | ICD-10-CM | POA: Diagnosis not present

## 2018-01-02 DIAGNOSIS — M25512 Pain in left shoulder: Secondary | ICD-10-CM | POA: Diagnosis not present

## 2018-01-13 DIAGNOSIS — M25512 Pain in left shoulder: Secondary | ICD-10-CM | POA: Diagnosis not present

## 2018-01-16 DIAGNOSIS — S4992XA Unspecified injury of left shoulder and upper arm, initial encounter: Secondary | ICD-10-CM | POA: Diagnosis not present

## 2018-01-16 DIAGNOSIS — M25512 Pain in left shoulder: Secondary | ICD-10-CM | POA: Diagnosis not present

## 2018-01-17 DIAGNOSIS — M25512 Pain in left shoulder: Secondary | ICD-10-CM | POA: Diagnosis not present

## 2018-01-17 DIAGNOSIS — M75122 Complete rotator cuff tear or rupture of left shoulder, not specified as traumatic: Secondary | ICD-10-CM | POA: Diagnosis not present

## 2018-01-20 DIAGNOSIS — M75122 Complete rotator cuff tear or rupture of left shoulder, not specified as traumatic: Secondary | ICD-10-CM | POA: Diagnosis not present

## 2018-01-20 DIAGNOSIS — M25512 Pain in left shoulder: Secondary | ICD-10-CM | POA: Diagnosis not present

## 2018-01-23 DIAGNOSIS — M6281 Muscle weakness (generalized): Secondary | ICD-10-CM | POA: Diagnosis not present

## 2018-01-23 DIAGNOSIS — M75122 Complete rotator cuff tear or rupture of left shoulder, not specified as traumatic: Secondary | ICD-10-CM | POA: Diagnosis not present

## 2018-01-23 DIAGNOSIS — M25512 Pain in left shoulder: Secondary | ICD-10-CM | POA: Diagnosis not present

## 2018-01-28 DIAGNOSIS — M25512 Pain in left shoulder: Secondary | ICD-10-CM | POA: Diagnosis not present

## 2018-01-28 DIAGNOSIS — M6281 Muscle weakness (generalized): Secondary | ICD-10-CM | POA: Diagnosis not present

## 2018-01-28 DIAGNOSIS — M75122 Complete rotator cuff tear or rupture of left shoulder, not specified as traumatic: Secondary | ICD-10-CM | POA: Diagnosis not present

## 2018-02-05 DIAGNOSIS — N200 Calculus of kidney: Secondary | ICD-10-CM | POA: Diagnosis not present

## 2018-02-05 DIAGNOSIS — N3090 Cystitis, unspecified without hematuria: Secondary | ICD-10-CM | POA: Diagnosis not present

## 2018-04-01 DIAGNOSIS — E785 Hyperlipidemia, unspecified: Secondary | ICD-10-CM | POA: Diagnosis not present

## 2018-04-01 DIAGNOSIS — Z1231 Encounter for screening mammogram for malignant neoplasm of breast: Secondary | ICD-10-CM | POA: Diagnosis not present

## 2018-04-01 DIAGNOSIS — I10 Essential (primary) hypertension: Secondary | ICD-10-CM | POA: Diagnosis not present

## 2018-04-01 DIAGNOSIS — Z79899 Other long term (current) drug therapy: Secondary | ICD-10-CM | POA: Diagnosis not present

## 2018-04-01 DIAGNOSIS — E1165 Type 2 diabetes mellitus with hyperglycemia: Secondary | ICD-10-CM | POA: Diagnosis not present

## 2018-04-14 DIAGNOSIS — D72829 Elevated white blood cell count, unspecified: Secondary | ICD-10-CM | POA: Diagnosis not present

## 2018-04-22 DIAGNOSIS — Z1231 Encounter for screening mammogram for malignant neoplasm of breast: Secondary | ICD-10-CM | POA: Diagnosis not present

## 2018-05-07 DIAGNOSIS — S134XXA Sprain of ligaments of cervical spine, initial encounter: Secondary | ICD-10-CM | POA: Diagnosis not present

## 2018-05-07 DIAGNOSIS — M9901 Segmental and somatic dysfunction of cervical region: Secondary | ICD-10-CM | POA: Diagnosis not present

## 2018-05-08 DIAGNOSIS — D72829 Elevated white blood cell count, unspecified: Secondary | ICD-10-CM | POA: Diagnosis not present

## 2018-05-09 DIAGNOSIS — S134XXA Sprain of ligaments of cervical spine, initial encounter: Secondary | ICD-10-CM | POA: Diagnosis not present

## 2018-05-09 DIAGNOSIS — M9901 Segmental and somatic dysfunction of cervical region: Secondary | ICD-10-CM | POA: Diagnosis not present

## 2018-05-14 DIAGNOSIS — S134XXA Sprain of ligaments of cervical spine, initial encounter: Secondary | ICD-10-CM | POA: Diagnosis not present

## 2018-05-14 DIAGNOSIS — M9901 Segmental and somatic dysfunction of cervical region: Secondary | ICD-10-CM | POA: Diagnosis not present

## 2018-05-15 DIAGNOSIS — S134XXA Sprain of ligaments of cervical spine, initial encounter: Secondary | ICD-10-CM | POA: Diagnosis not present

## 2018-05-15 DIAGNOSIS — M9901 Segmental and somatic dysfunction of cervical region: Secondary | ICD-10-CM | POA: Diagnosis not present

## 2018-05-21 DIAGNOSIS — M9901 Segmental and somatic dysfunction of cervical region: Secondary | ICD-10-CM | POA: Diagnosis not present

## 2018-05-21 DIAGNOSIS — S134XXA Sprain of ligaments of cervical spine, initial encounter: Secondary | ICD-10-CM | POA: Diagnosis not present

## 2018-05-30 DIAGNOSIS — M9901 Segmental and somatic dysfunction of cervical region: Secondary | ICD-10-CM | POA: Diagnosis not present

## 2018-05-30 DIAGNOSIS — S134XXA Sprain of ligaments of cervical spine, initial encounter: Secondary | ICD-10-CM | POA: Diagnosis not present

## 2018-06-04 DIAGNOSIS — S134XXA Sprain of ligaments of cervical spine, initial encounter: Secondary | ICD-10-CM | POA: Diagnosis not present

## 2018-06-04 DIAGNOSIS — M9901 Segmental and somatic dysfunction of cervical region: Secondary | ICD-10-CM | POA: Diagnosis not present

## 2018-06-18 DIAGNOSIS — M9901 Segmental and somatic dysfunction of cervical region: Secondary | ICD-10-CM | POA: Diagnosis not present

## 2018-06-18 DIAGNOSIS — S134XXA Sprain of ligaments of cervical spine, initial encounter: Secondary | ICD-10-CM | POA: Diagnosis not present

## 2018-06-19 DIAGNOSIS — M25512 Pain in left shoulder: Secondary | ICD-10-CM | POA: Diagnosis not present

## 2018-06-19 DIAGNOSIS — M75102 Unspecified rotator cuff tear or rupture of left shoulder, not specified as traumatic: Secondary | ICD-10-CM | POA: Diagnosis not present

## 2018-06-19 DIAGNOSIS — M5030 Other cervical disc degeneration, unspecified cervical region: Secondary | ICD-10-CM | POA: Diagnosis not present

## 2018-06-24 DIAGNOSIS — S199XXA Unspecified injury of neck, initial encounter: Secondary | ICD-10-CM | POA: Diagnosis not present

## 2018-06-24 DIAGNOSIS — M5030 Other cervical disc degeneration, unspecified cervical region: Secondary | ICD-10-CM | POA: Diagnosis not present

## 2018-06-25 DIAGNOSIS — M9901 Segmental and somatic dysfunction of cervical region: Secondary | ICD-10-CM | POA: Diagnosis not present

## 2018-06-25 DIAGNOSIS — S134XXA Sprain of ligaments of cervical spine, initial encounter: Secondary | ICD-10-CM | POA: Diagnosis not present

## 2018-07-03 DIAGNOSIS — M25512 Pain in left shoulder: Secondary | ICD-10-CM | POA: Diagnosis not present

## 2018-07-04 DIAGNOSIS — M9901 Segmental and somatic dysfunction of cervical region: Secondary | ICD-10-CM | POA: Diagnosis not present

## 2018-07-04 DIAGNOSIS — S134XXA Sprain of ligaments of cervical spine, initial encounter: Secondary | ICD-10-CM | POA: Diagnosis not present

## 2018-07-09 ENCOUNTER — Other Ambulatory Visit: Payer: Self-pay

## 2018-08-05 DIAGNOSIS — R1032 Left lower quadrant pain: Secondary | ICD-10-CM | POA: Diagnosis not present

## 2018-08-05 DIAGNOSIS — N3281 Overactive bladder: Secondary | ICD-10-CM | POA: Diagnosis not present

## 2018-08-05 DIAGNOSIS — N200 Calculus of kidney: Secondary | ICD-10-CM | POA: Diagnosis not present

## 2018-08-06 DIAGNOSIS — N200 Calculus of kidney: Secondary | ICD-10-CM | POA: Diagnosis not present

## 2018-09-12 DIAGNOSIS — L989 Disorder of the skin and subcutaneous tissue, unspecified: Secondary | ICD-10-CM | POA: Diagnosis not present

## 2018-09-12 DIAGNOSIS — I10 Essential (primary) hypertension: Secondary | ICD-10-CM | POA: Diagnosis not present

## 2018-09-12 DIAGNOSIS — R251 Tremor, unspecified: Secondary | ICD-10-CM | POA: Diagnosis not present

## 2018-09-12 DIAGNOSIS — G4733 Obstructive sleep apnea (adult) (pediatric): Secondary | ICD-10-CM | POA: Diagnosis not present

## 2018-09-12 DIAGNOSIS — R350 Frequency of micturition: Secondary | ICD-10-CM | POA: Diagnosis not present

## 2018-09-12 DIAGNOSIS — Z79899 Other long term (current) drug therapy: Secondary | ICD-10-CM | POA: Diagnosis not present

## 2018-09-12 DIAGNOSIS — E785 Hyperlipidemia, unspecified: Secondary | ICD-10-CM | POA: Diagnosis not present

## 2018-09-12 DIAGNOSIS — E1165 Type 2 diabetes mellitus with hyperglycemia: Secondary | ICD-10-CM | POA: Diagnosis not present

## 2018-09-12 DIAGNOSIS — Z6841 Body Mass Index (BMI) 40.0 and over, adult: Secondary | ICD-10-CM | POA: Diagnosis not present

## 2018-09-24 DIAGNOSIS — C44329 Squamous cell carcinoma of skin of other parts of face: Secondary | ICD-10-CM | POA: Diagnosis not present

## 2018-09-24 DIAGNOSIS — L814 Other melanin hyperpigmentation: Secondary | ICD-10-CM | POA: Diagnosis not present

## 2018-09-24 DIAGNOSIS — L821 Other seborrheic keratosis: Secondary | ICD-10-CM | POA: Diagnosis not present

## 2018-09-24 DIAGNOSIS — L820 Inflamed seborrheic keratosis: Secondary | ICD-10-CM | POA: Diagnosis not present

## 2018-09-24 DIAGNOSIS — D2239 Melanocytic nevi of other parts of face: Secondary | ICD-10-CM | POA: Diagnosis not present

## 2018-10-30 ENCOUNTER — Other Ambulatory Visit: Payer: Self-pay

## 2018-11-11 DIAGNOSIS — J40 Bronchitis, not specified as acute or chronic: Secondary | ICD-10-CM | POA: Diagnosis not present

## 2018-11-11 DIAGNOSIS — Z6841 Body Mass Index (BMI) 40.0 and over, adult: Secondary | ICD-10-CM | POA: Diagnosis not present

## 2018-11-11 DIAGNOSIS — R05 Cough: Secondary | ICD-10-CM | POA: Diagnosis not present

## 2018-11-23 DIAGNOSIS — N200 Calculus of kidney: Secondary | ICD-10-CM | POA: Diagnosis not present

## 2018-11-23 DIAGNOSIS — R1032 Left lower quadrant pain: Secondary | ICD-10-CM | POA: Diagnosis not present

## 2018-11-24 DIAGNOSIS — R3989 Other symptoms and signs involving the genitourinary system: Secondary | ICD-10-CM | POA: Diagnosis not present

## 2018-11-24 DIAGNOSIS — R350 Frequency of micturition: Secondary | ICD-10-CM | POA: Diagnosis not present

## 2018-11-25 DIAGNOSIS — N390 Urinary tract infection, site not specified: Secondary | ICD-10-CM | POA: Diagnosis not present

## 2018-12-05 DIAGNOSIS — Z23 Encounter for immunization: Secondary | ICD-10-CM | POA: Diagnosis not present

## 2019-01-13 DIAGNOSIS — I10 Essential (primary) hypertension: Secondary | ICD-10-CM | POA: Diagnosis not present

## 2019-01-13 DIAGNOSIS — Z79899 Other long term (current) drug therapy: Secondary | ICD-10-CM | POA: Diagnosis not present

## 2019-01-13 DIAGNOSIS — N200 Calculus of kidney: Secondary | ICD-10-CM | POA: Diagnosis not present

## 2019-01-13 DIAGNOSIS — Z87440 Personal history of urinary (tract) infections: Secondary | ICD-10-CM | POA: Diagnosis not present

## 2019-01-13 DIAGNOSIS — E785 Hyperlipidemia, unspecified: Secondary | ICD-10-CM | POA: Diagnosis not present

## 2019-01-13 DIAGNOSIS — E1165 Type 2 diabetes mellitus with hyperglycemia: Secondary | ICD-10-CM | POA: Diagnosis not present

## 2019-01-13 DIAGNOSIS — G4733 Obstructive sleep apnea (adult) (pediatric): Secondary | ICD-10-CM | POA: Diagnosis not present

## 2019-01-13 DIAGNOSIS — Z6841 Body Mass Index (BMI) 40.0 and over, adult: Secondary | ICD-10-CM | POA: Diagnosis not present

## 2019-04-20 DIAGNOSIS — G4733 Obstructive sleep apnea (adult) (pediatric): Secondary | ICD-10-CM | POA: Diagnosis not present

## 2019-04-20 DIAGNOSIS — E785 Hyperlipidemia, unspecified: Secondary | ICD-10-CM | POA: Diagnosis not present

## 2019-04-20 DIAGNOSIS — E1165 Type 2 diabetes mellitus with hyperglycemia: Secondary | ICD-10-CM | POA: Diagnosis not present

## 2019-04-20 DIAGNOSIS — I10 Essential (primary) hypertension: Secondary | ICD-10-CM | POA: Diagnosis not present

## 2019-04-20 DIAGNOSIS — Z79899 Other long term (current) drug therapy: Secondary | ICD-10-CM | POA: Diagnosis not present

## 2019-04-20 DIAGNOSIS — Z6841 Body Mass Index (BMI) 40.0 and over, adult: Secondary | ICD-10-CM | POA: Diagnosis not present

## 2019-06-15 DIAGNOSIS — M25512 Pain in left shoulder: Secondary | ICD-10-CM | POA: Diagnosis not present

## 2019-06-15 DIAGNOSIS — M542 Cervicalgia: Secondary | ICD-10-CM | POA: Diagnosis not present

## 2019-06-25 DIAGNOSIS — M25512 Pain in left shoulder: Secondary | ICD-10-CM | POA: Diagnosis not present

## 2019-06-25 DIAGNOSIS — S46012A Strain of muscle(s) and tendon(s) of the rotator cuff of left shoulder, initial encounter: Secondary | ICD-10-CM | POA: Diagnosis not present

## 2019-07-01 DIAGNOSIS — M25512 Pain in left shoulder: Secondary | ICD-10-CM | POA: Diagnosis not present

## 2019-07-20 ENCOUNTER — Other Ambulatory Visit: Payer: Self-pay | Admitting: Orthopaedic Surgery

## 2019-07-21 DIAGNOSIS — N200 Calculus of kidney: Secondary | ICD-10-CM | POA: Diagnosis not present

## 2019-07-21 DIAGNOSIS — N3090 Cystitis, unspecified without hematuria: Secondary | ICD-10-CM | POA: Diagnosis not present

## 2019-07-23 DIAGNOSIS — N200 Calculus of kidney: Secondary | ICD-10-CM | POA: Diagnosis not present

## 2019-08-03 DIAGNOSIS — Z01818 Encounter for other preprocedural examination: Secondary | ICD-10-CM | POA: Diagnosis not present

## 2019-08-03 DIAGNOSIS — M7061 Trochanteric bursitis, right hip: Secondary | ICD-10-CM | POA: Diagnosis not present

## 2019-08-03 DIAGNOSIS — Z6841 Body Mass Index (BMI) 40.0 and over, adult: Secondary | ICD-10-CM | POA: Diagnosis not present

## 2019-08-03 DIAGNOSIS — Z79899 Other long term (current) drug therapy: Secondary | ICD-10-CM | POA: Diagnosis not present

## 2019-08-03 DIAGNOSIS — E1165 Type 2 diabetes mellitus with hyperglycemia: Secondary | ICD-10-CM | POA: Diagnosis not present

## 2019-08-03 DIAGNOSIS — E785 Hyperlipidemia, unspecified: Secondary | ICD-10-CM | POA: Diagnosis not present

## 2019-08-03 DIAGNOSIS — I10 Essential (primary) hypertension: Secondary | ICD-10-CM | POA: Diagnosis not present

## 2019-08-07 ENCOUNTER — Encounter (HOSPITAL_COMMUNITY): Payer: Medicare Other

## 2019-08-31 NOTE — Patient Instructions (Addendum)
DUE TO COVID-19 ONLY ONE VISITOR IS ALLOWED TO COME WITH YOU AND STAY IN THE WAITING ROOM ONLY DURING PRE OP AND PROCEDURE DAY OF SURGERY. THE 1 VISITOR MAY VISIT WITH YOU AFTER SURGERY IN YOUR PRIVATE ROOM DURING VISITING HOURS ONLY!  YOU NEED TO HAVE A COVID 19 TEST ON_______ @_______ , THIS TEST MUST BE DONE BEFORE SURGERY, COME  [place], [place]  , [id number].  ([place]) ONCE YOUR COVID TEST IS COMPLETED, PLEASE BEGIN THE QUARANTINE INSTRUCTIONS AS OUTLINED IN YOUR HANDOUT.                Margaret Rocha  08/31/2019   Your procedure is scheduled on: 09-08-19    Report to [facility] Main  Entrance              Report to admitting at          130 PM     Call this number if you have problems the morning of surgery [phone]    Remember: NO SOLID FOOD AFTER MIDNIGHT THE NIGHT PRIOR TO SURGERY. NOTHING BY MOUTH EXCEPT CLEAR LIQUIDS UNTIL      1230 pm . PLEASE FINISH ENSURE DRINK PER SURGEON ORDER  WHICH NEEDS TO BE COMPLETED AT    1230 pm then nothing by mouth .     CLEAR LIQUID DIET   Foods Allowed                                                                     Foods Excluded  Coffee and tea, regular and decaf                             liquids that you cannot  Plain Jell-O any favor except red or purple                                           see through such as: Fruit ices (not with fruit pulp)                                     milk, soups, orange juice  Iced Popsicles                                    All solid food Carbonated beverages, regular and diet                                    Cranberry, grape and apple juices Sports drinks like Gatorade Lightly seasoned clear broth or consume(fat free) Sugar, honey syrup  Sample Menu Breakfast                                Lunch  Supper Cranberry juice                    Beef broth                            Chicken broth Jell-O                                      Grape juice                           Apple juice Coffee or tea                        Jell-O                                      Popsicle                                                Coffee or tea                        Coffee or tea  _____________________________________________________________________    BRUSH YOUR TEETH MORNING OF SURGERY AND RINSE YOUR MOUTH OUT, NO CHEWING GUM CANDY OR MINTS.     Take these medicines the morning of surgery with A SIP OF WATER: effexor, prevastatin, ditropan, omeprazole, amlodipine,claritin  DO NOT TAKE ANY DIABETIC MEDICATIONS DAY OF YOUR SURGERY                               You may not have any metal on your body including hair pins and              piercings  Do not wear jewelry, make-up, lotions, powders or perfumes, deodorant             Do not wear nail polish.  Do not shave  48 hours prior to surgery.           Do not bring valuables to the hospital. [place].  Contacts, dentures or bridgework may not be worn into surgery.                 Please read over the following fact sheets you were given: _____________________________________________________________________           [facility] - Preparing for Surgery Before surgery, you can play an important role.  Because skin is not sterile, your skin needs to be as free of germs as possible.  You can reduce the number of germs on your skin by washing with CHG (chlorahexidine gluconate) soap before surgery.  CHG is an antiseptic cleaner which kills germs and bonds with the skin to continue killing germs even after washing. Please DO NOT use if you have an allergy to CHG or antibacterial soaps.  If your skin becomes reddened/irritated stop using the CHG and inform your nurse when you arrive  at Short Stay. Do not shave (including legs and underarms) for at least 48 hours prior to the first CHG shower.  You may shave your  face/neck. Please follow these instructions carefully:  1.  Shower with CHG Soap the night before surgery and the  morning of Surgery.  2.  If you choose to wash your hair, wash your hair first as usual with your  normal  shampoo.  3.  After you shampoo, rinse your hair and body thoroughly to remove the  shampoo.                           4.  Use CHG as you would any other liquid soap.  You can apply chg directly  to the skin and wash                       Gently with a scrungie or clean washcloth.  5.  Apply the CHG Soap to your body ONLY FROM THE NECK DOWN.   Do not use on face/ open                           Wound or open sores. Avoid contact with eyes, ears mouth and genitals (private parts).                       Wash face,  Genitals (private parts) with your normal soap.             6.  Wash thoroughly, paying special attention to the area where your surgery  will be performed.  7.  Thoroughly rinse your body with warm water from the neck down.  8.  DO NOT shower/wash with your normal soap after using and rinsing off  the CHG Soap.                9.  Pat yourself dry with a clean towel.            10.  Wear clean pajamas.            11.  Place clean sheets on your bed the night of your first shower and do not  sleep with pets. Day of Surgery : Do not apply any lotions/deodorants the morning of surgery.  Please wear clean clothes to the hospital/surgery center.  FAILURE TO FOLLOW THESE INSTRUCTIONS MAY RESULT IN THE CANCELLATION OF YOUR SURGERY PATIENT SIGNATURE_________________________________  NURSE SIGNATURE__________________________________  ________________________________________________________________________ How to Manage Your Diabetes Before and After Surgery  Why is it important to control my blood sugar before and after surgery? . Improving blood sugar levels before and after surgery helps healing and can limit problems. . A way of improving blood sugar control is eating  a healthy diet by: o  Eating less sugar and carbohydrates o  Increasing activity/exercise o  Talking with your doctor about reaching your blood sugar goals . High blood sugars (greater than 180 mg/dL) can raise your risk of infections and slow your recovery, so you will need to focus on controlling your diabetes during the weeks before surgery. . Make sure that the doctor who takes care of your diabetes knows about your planned surgery including the date and location.  How do I manage my blood sugar before surgery? . Check your blood sugar at least 4 times a day, starting 2 days before surgery, to make sure that  the level is not too high or low. o Check your blood sugar the morning of your surgery when you wake up and every 2 hours until you get to the Short Stay unit. . If your blood sugar is less than 70 mg/dL, you will need to treat for low blood sugar: o Do not take insulin. o Treat a low blood sugar (less than 70 mg/dL) with  cup of clear juice (cranberry or apple), 4 glucose tablets, OR glucose gel. o Recheck blood sugar in 15 minutes after treatment (to make sure it is greater than 70 mg/dL). If your blood sugar is not greater than 70 mg/dL on recheck, call [phone] for further instructions. . Report your blood sugar to the short stay nurse when you get to Short Stay.  . If you are admitted to the hospital after surgery: o Your blood sugar will be checked by the staff and you will probably be given insulin after surgery (instead of oral diabetes medicines) to make sure you have good blood sugar levels. o The goal for blood sugar control after surgery is 80-180 mg/dL.   WHAT DO I DO ABOUT MY DIABETES MEDICATION?  Marland Kitchen Do not take oral diabetes medicines (pills) the morning of surgery.

## 2019-09-03 ENCOUNTER — Other Ambulatory Visit: Payer: Self-pay

## 2019-09-03 ENCOUNTER — Encounter (INDEPENDENT_AMBULATORY_CARE_PROVIDER_SITE_OTHER): Payer: Self-pay

## 2019-09-03 ENCOUNTER — Encounter (HOSPITAL_COMMUNITY): Payer: Self-pay

## 2019-09-03 ENCOUNTER — Encounter (HOSPITAL_COMMUNITY)
Admission: RE | Admit: 2019-09-03 | Discharge: 2019-09-03 | Disposition: A | Discharge status: Home / Self Care | Payer: Medicare Other | Source: Ambulatory Visit | Attending: Orthopaedic Surgery | Admitting: Orthopaedic Surgery

## 2019-09-03 DIAGNOSIS — K219 Gastro-esophageal reflux disease without esophagitis: Secondary | ICD-10-CM | POA: Diagnosis not present

## 2019-09-03 DIAGNOSIS — M75102 Unspecified rotator cuff tear or rupture of left shoulder, not specified as traumatic: Secondary | ICD-10-CM | POA: Insufficient documentation

## 2019-09-03 DIAGNOSIS — Z01812 Encounter for preprocedural laboratory examination: Secondary | ICD-10-CM | POA: Insufficient documentation

## 2019-09-03 DIAGNOSIS — E785 Hyperlipidemia, unspecified: Secondary | ICD-10-CM | POA: Insufficient documentation

## 2019-09-03 DIAGNOSIS — G4730 Sleep apnea, unspecified: Secondary | ICD-10-CM | POA: Diagnosis not present

## 2019-09-03 DIAGNOSIS — E119 Type 2 diabetes mellitus without complications: Secondary | ICD-10-CM | POA: Diagnosis not present

## 2019-09-03 DIAGNOSIS — I10 Essential (primary) hypertension: Secondary | ICD-10-CM | POA: Insufficient documentation

## 2019-09-03 HISTORY — DX: Personal history of urinary calculi: Z87.442

## 2019-09-03 HISTORY — DX: Bursopathy, unspecified: M71.9

## 2019-09-03 HISTORY — DX: Pneumonia, unspecified organism: J18.9

## 2019-09-03 HISTORY — DX: Unspecified osteoarthritis, unspecified site: M19.90

## 2019-09-03 LAB — BASIC METABOLIC PANEL
Anion gap: 12 (ref 5–15)
BUN: 12 mg/dL (ref 8–23)
CO2: 27 mmol/L (ref 22–32)
Calcium: 9.7 mg/dL (ref 8.9–10.3)
Chloride: 101 mmol/L (ref 98–111)
Creatinine, Ser: 0.79 mg/dL (ref 0.44–1.00)
GFR calc Af Amer: >60 mL/min (ref >60)
GFR calc non Af Amer: >60 mL/min (ref >60)
Glucose, Bld: 135 mg/dL — ABNORMAL HIGH (ref 70–99)
Potassium: 3.7 mmol/L (ref 3.5–5.1)
Sodium: 140 mmol/L (ref 135–145)

## 2019-09-03 LAB — CBC
HCT: 43.9 % (ref 36.0–46.0)
Hemoglobin: 14.3 g/dL (ref 12.0–15.0)
MCH: 30.3 pg (ref 26.0–34.0)
MCHC: 32.6 g/dL (ref 30.0–36.0)
MCV: 93 fL (ref 80.0–100.0)
Platelets: 272 10*3/uL (ref 150–400)
RBC: 4.72 MIL/uL (ref 3.87–5.11)
RDW: 13.2 % (ref 11.5–15.5)
WBC: 11.3 10*3/uL — ABNORMAL HIGH (ref 4.0–10.5)
nRBC: 0 % (ref 0.0–0.2)

## 2019-09-03 LAB — GLUCOSE, CAPILLARY: Glucose-Capillary: 168 mg/dL — ABNORMAL HIGH (ref 70–99)

## 2019-09-03 LAB — HEMOGLOBIN A1C
Hgb A1c MFr Bld: 7.3 % — ABNORMAL HIGH (ref 4.8–5.6)
Mean Plasma Glucose: 162.81 mg/dL

## 2019-09-03 NOTE — Progress Notes (Signed)
PCP - Ernestene Kiel Cardiologist - none  Chest x-ray -  EKG - 08-03-19 on chart Stress Test -  ECHO -  Cardiac Cath -   Sleep Study - yes CPAP - yes  Fasting Blood Sugar -  Checks Blood Sugar _1____ times a day  Blood Thinner Instructions: Aspirin Instructions: Last Dose:  Anesthesia review: OSA cpap  Patient denies shortness of breath, fever, cough and chest pain at PAT appointment   NONE   Patient verbalized understanding of instructions that were given to them at the PAT appointment. Patient was also instructed that they will need to review over the PAT instructions again at home before surgery.

## 2019-09-04 ENCOUNTER — Other Ambulatory Visit (HOSPITAL_COMMUNITY)
Admission: RE | Admit: 2019-09-04 | Discharge: 2019-09-04 | Disposition: A | Discharge status: Home / Self Care | Payer: Medicare Other | Source: Ambulatory Visit | Attending: Orthopaedic Surgery | Admitting: Orthopaedic Surgery

## 2019-09-04 DIAGNOSIS — Z01812 Encounter for preprocedural laboratory examination: Secondary | ICD-10-CM | POA: Diagnosis not present

## 2019-09-04 DIAGNOSIS — Z20828 Contact with and (suspected) exposure to other viral communicable diseases: Secondary | ICD-10-CM | POA: Insufficient documentation

## 2019-09-05 LAB — NOVEL CORONAVIRUS, NAA (HOSP ORDER, SEND-OUT TO REF LAB; TAT 18-24 HRS): SARS-CoV-2, NAA: NOT DETECTED

## 2019-09-07 NOTE — H&P (Signed)
Margaret Rocha is an 73 y.o. female.   Chief Complaint: left shoulder pain HPI: Jan is in again about her left shoulder which continues to bother her terribly.  She has constant moderate and occasional severe pain with use of this arm.  She cannot sleep at night on this side.  She cannot reach up or out without extreme discomfort.  She has been through a new MRI scan of her shoulder since she was last in.    MRI:  I reviewed an MRI scan films and report of a study done at [facility] part of [facility] in the [place] system.  She has complete retracted tears of the supraspinatus and subscapularis.  She has significant muscle atrophy especially at the subscapularis.  The intra-articular portion of the biceps tendon is not well visualized.  Past Medical History:  Diagnosis Date  . Adenomatous colon polyp   . Arthritis   . Bursitis    right hip  . Complication of anesthesia    turned gray before not sure if from anesthesia  . Diabetes mellitus    type 2  . Diverticulosis   . GERD (gastroesophageal reflux disease)   . History of kidney stones   . Hyperlipemia   . Hypertension   . Pneumonia   . Sleep apnea    obstructive  . Urinary incontinence     Past Surgical History:  Procedure Laterality Date  . ABDOMINAL HYSTERECTOMY    . CARPAL TUNNEL RELEASE    . COLONOSCOPY  02/25/2012   Procedure: COLONOSCOPY;  Surgeon: Juanita Craver, MD;  Location: [facility] ENDOSCOPY;  Service: Endoscopy;  Laterality: N/A;  . DILATION AND CURETTAGE OF UTERUS    . EYE SURGERY     caract  . JOINT REPLACEMENT     knee   bil  . TONSILLECTOMY      No family history on file. Social History:  reports that she has never smoked. She has never used smokeless tobacco. She reports that she does not drink alcohol or use drugs.  Allergies: No Known Allergies  No medications prior to admission.    No results found for this or any previous visit (from the past 48 hour(s)). No results found.  Review of  Systems  Musculoskeletal: Positive for joint pain.       Left shoulder  All other systems reviewed and are negative.   There were no vitals taken for this visit. Physical Exam  Constitutional: She is oriented to person, place, and time. She appears well-developed and well-nourished.  HENT:  Head: Normocephalic and atraumatic.  Eyes: Pupils are equal, round, and reactive to light.  Neck: Normal range of motion.  Cardiovascular: Normal rate.  Respiratory: Effort normal.  GI: Soft.  Musculoskeletal:     Comments: Left shoulder motion is a little limited.  She can forward flex to about 90 actively and passively I can bring her up to 140.  She has a painful arc bringing it up and down.  External rotation is an easy 45 with internal rotation to her side pocket.  She seems weak in both external rotation and forward flexion.  Sensation and motor function are intact in her hands with palpable pulses on both sides.  She has normal unlabored respirations.  She does have some significant central obesity with BMI 42.    Neurological: She is alert and oriented to person, place, and time.  Skin: Skin is warm and dry.  Psychiatric: She has a normal mood and affect.  Her behavior is normal. Judgment and thought content normal.     Assessment/Plan Assessment:  Left shoulder large rotator cuff tears by MRI 2019 and 2020  Plan: Jan is about a year and a half from her injury.  Unfortunately the rotator cuff tears are larger and at this point she has significant muscle atrophy on the recent scan.  I think her best option would be arthroscopic debridement rather than repair.  I reviewed risk of anesthesia, infection, DVT.  The plan will be to perform an acromioplasty along with debridement of the cuff and most likely a biceps tenolysis.  Larwance Sachs Nikolis Berent, PA-C 09/07/2019, 6:59 PM

## 2019-09-08 ENCOUNTER — Ambulatory Visit (HOSPITAL_COMMUNITY): Payer: Medicare Other | Admitting: Certified Registered"

## 2019-09-08 ENCOUNTER — Encounter (HOSPITAL_COMMUNITY)
Admission: RE | Disposition: A | Discharge status: Home / Self Care | Payer: Self-pay | Source: Home / Self Care | Attending: Orthopaedic Surgery

## 2019-09-08 ENCOUNTER — Telehealth (HOSPITAL_COMMUNITY): Payer: Self-pay | Admitting: *Deleted

## 2019-09-08 ENCOUNTER — Encounter (HOSPITAL_COMMUNITY): Payer: Self-pay | Admitting: Certified Registered"

## 2019-09-08 ENCOUNTER — Ambulatory Visit (HOSPITAL_COMMUNITY): Payer: Medicare Other | Admitting: Physician Assistant

## 2019-09-08 ENCOUNTER — Ambulatory Visit (HOSPITAL_COMMUNITY)
Admission: RE | Admit: 2019-09-08 | Discharge: 2019-09-08 | Disposition: A | Discharge status: Home / Self Care | Payer: Medicare Other | Attending: Orthopaedic Surgery | Admitting: Orthopaedic Surgery

## 2019-09-08 ENCOUNTER — Other Ambulatory Visit: Payer: Self-pay

## 2019-09-08 DIAGNOSIS — K5790 Diverticulosis of intestine, part unspecified, without perforation or abscess without bleeding: Secondary | ICD-10-CM | POA: Diagnosis not present

## 2019-09-08 DIAGNOSIS — K219 Gastro-esophageal reflux disease without esophagitis: Secondary | ICD-10-CM | POA: Insufficient documentation

## 2019-09-08 DIAGNOSIS — G8918 Other acute postprocedural pain: Secondary | ICD-10-CM | POA: Diagnosis not present

## 2019-09-08 DIAGNOSIS — G4730 Sleep apnea, unspecified: Secondary | ICD-10-CM | POA: Diagnosis not present

## 2019-09-08 DIAGNOSIS — E785 Hyperlipidemia, unspecified: Secondary | ICD-10-CM | POA: Diagnosis not present

## 2019-09-08 DIAGNOSIS — M7582 Other shoulder lesions, left shoulder: Secondary | ICD-10-CM | POA: Diagnosis not present

## 2019-09-08 DIAGNOSIS — M75102 Unspecified rotator cuff tear or rupture of left shoulder, not specified as traumatic: Secondary | ICD-10-CM | POA: Diagnosis not present

## 2019-09-08 DIAGNOSIS — S46212A Strain of muscle, fascia and tendon of other parts of biceps, left arm, initial encounter: Secondary | ICD-10-CM | POA: Diagnosis not present

## 2019-09-08 DIAGNOSIS — I10 Essential (primary) hypertension: Secondary | ICD-10-CM | POA: Diagnosis not present

## 2019-09-08 DIAGNOSIS — Z87442 Personal history of urinary calculi: Secondary | ICD-10-CM | POA: Diagnosis not present

## 2019-09-08 DIAGNOSIS — Z90710 Acquired absence of both cervix and uterus: Secondary | ICD-10-CM | POA: Insufficient documentation

## 2019-09-08 DIAGNOSIS — M1990 Unspecified osteoarthritis, unspecified site: Secondary | ICD-10-CM | POA: Diagnosis not present

## 2019-09-08 DIAGNOSIS — F329 Major depressive disorder, single episode, unspecified: Secondary | ICD-10-CM | POA: Diagnosis not present

## 2019-09-08 DIAGNOSIS — E119 Type 2 diabetes mellitus without complications: Secondary | ICD-10-CM | POA: Insufficient documentation

## 2019-09-08 DIAGNOSIS — S46112S Strain of muscle, fascia and tendon of long head of biceps, left arm, sequela: Secondary | ICD-10-CM | POA: Diagnosis not present

## 2019-09-08 DIAGNOSIS — Z86010 Personal history of colonic polyps: Secondary | ICD-10-CM | POA: Diagnosis not present

## 2019-09-08 DIAGNOSIS — Z96653 Presence of artificial knee joint, bilateral: Secondary | ICD-10-CM | POA: Diagnosis not present

## 2019-09-08 DIAGNOSIS — R32 Unspecified urinary incontinence: Secondary | ICD-10-CM | POA: Insufficient documentation

## 2019-09-08 DIAGNOSIS — M75122 Complete rotator cuff tear or rupture of left shoulder, not specified as traumatic: Secondary | ICD-10-CM | POA: Insufficient documentation

## 2019-09-08 DIAGNOSIS — Z6841 Body Mass Index (BMI) 40.0 and over, adult: Secondary | ICD-10-CM | POA: Diagnosis not present

## 2019-09-08 HISTORY — PX: SHOULDER ARTHROSCOPY: SHX128

## 2019-09-08 LAB — GLUCOSE, CAPILLARY
Glucose-Capillary: 122 mg/dL — ABNORMAL HIGH (ref 70–99)
Glucose-Capillary: 101 mg/dL — ABNORMAL HIGH (ref 70–99)

## 2019-09-08 SURGERY — ARTHROSCOPY, SHOULDER
Anesthesia: General | Laterality: Left

## 2019-09-08 MED ORDER — LIDOCAINE 2% (20 MG/ML) 5 ML SYRINGE: INTRAMUSCULAR | Status: DC | PRN

## 2019-09-08 MED ORDER — LACTATED RINGERS IV SOLN: INTRAVENOUS | Status: DC

## 2019-09-08 MED ORDER — ONDANSETRON HCL 4 MG/2ML IJ SOLN: 4.0000 mg | Freq: Once | INTRAMUSCULAR | Status: DC | PRN

## 2019-09-08 MED ORDER — MEPERIDINE HCL 50 MG/ML IJ SOLN: 6.2500 mg | INTRAMUSCULAR | Status: DC | PRN

## 2019-09-08 MED ORDER — ACETAMINOPHEN 325 MG PO TABS: 325.0000 mg | ORAL_TABLET | ORAL | Status: DC | PRN

## 2019-09-08 MED ORDER — KETOROLAC TROMETHAMINE 15 MG/ML IJ SOLN: 15.0000 mg | Freq: Once | INTRAMUSCULAR | Status: DC

## 2019-09-08 MED ORDER — MIDAZOLAM HCL 2 MG/2ML IJ SOLN
INTRAMUSCULAR | Status: AC
  Filled 2019-09-08: qty 2

## 2019-09-08 MED ORDER — SODIUM CHLORIDE 0.9 % IV SOLN
INTRAVENOUS | Status: DC | PRN
  Administered 2019-09-08: 15:00:00 15 ug/min via INTRAVENOUS

## 2019-09-08 MED ORDER — MIDAZOLAM HCL 2 MG/2ML IJ SOLN
INTRAMUSCULAR | Status: DC | PRN
  Administered 2019-09-08 (×2): 0.5 mg via INTRAVENOUS

## 2019-09-08 MED ORDER — ALBUTEROL SULFATE (2.5 MG/3ML) 0.083% IN NEBU
2.5000 mg | INHALATION_SOLUTION | Freq: Four times a day (QID) | RESPIRATORY_TRACT | Status: DC | PRN
  Administered 2019-09-08: 2.5 mg via RESPIRATORY_TRACT

## 2019-09-08 MED ORDER — ALBUTEROL SULFATE (2.5 MG/3ML) 0.083% IN NEBU
INHALATION_SOLUTION | RESPIRATORY_TRACT | Status: AC
  Filled 2019-09-08: qty 3

## 2019-09-08 MED ORDER — LIDOCAINE 2% (20 MG/ML) 5 ML SYRINGE
INTRAMUSCULAR | Status: DC | PRN
  Administered 2019-09-08: 100 mg via INTRAVENOUS

## 2019-09-08 MED ORDER — PROPOFOL 10 MG/ML IV BOLUS
INTRAVENOUS | Status: DC | PRN
  Administered 2019-09-08: 150 mg via INTRAVENOUS

## 2019-09-08 MED ORDER — PROPOFOL 10 MG/ML IV BOLUS
INTRAVENOUS | Status: AC
  Filled 2019-09-08: qty 20

## 2019-09-08 MED ORDER — OXYCODONE HCL 5 MG PO TABS: 5.0000 mg | ORAL_TABLET | Freq: Once | ORAL | Status: DC | PRN

## 2019-09-08 MED ORDER — SUCCINYLCHOLINE CHLORIDE 200 MG/10ML IV SOSY
PREFILLED_SYRINGE | INTRAVENOUS | Status: DC | PRN
  Administered 2019-09-08: 140 mg via INTRAVENOUS

## 2019-09-08 MED ORDER — ROPIVACAINE HCL 5 MG/ML IJ SOLN
INTRAMUSCULAR | Status: DC | PRN
  Administered 2019-09-08 (×4): 5 mL via PERINEURAL

## 2019-09-08 MED ORDER — POVIDONE-IODINE 10 % EX SWAB
2.0000 "application " | Freq: Once | CUTANEOUS | Status: AC
  Administered 2019-09-08: 2 via TOPICAL

## 2019-09-08 MED ORDER — HYDROCODONE-ACETAMINOPHEN 5-325 MG PO TABS: 1.0000 | ORAL_TABLET | Freq: Four times a day (QID) | ORAL | 0 refills | Status: DC | PRN

## 2019-09-08 MED ORDER — ONDANSETRON HCL 4 MG/2ML IJ SOLN
INTRAMUSCULAR | Status: DC | PRN
  Administered 2019-09-08: 4 mg via INTRAVENOUS

## 2019-09-08 MED ORDER — ACETAMINOPHEN 160 MG/5ML PO SOLN: 325.0000 mg | ORAL | Status: DC | PRN

## 2019-09-08 MED ORDER — BUPIVACAINE-EPINEPHRINE 0.5% -1:200000 IJ SOLN
INTRAMUSCULAR | Status: AC
  Filled 2019-09-08: qty 1

## 2019-09-08 MED ORDER — LACTATED RINGERS IR SOLN
Status: DC | PRN
  Administered 2019-09-08: 6000 mL

## 2019-09-08 MED ORDER — 0.9 % SODIUM CHLORIDE (POUR BTL) OPTIME
TOPICAL | Status: DC | PRN
  Administered 2019-09-08: 1000 mL

## 2019-09-08 MED ORDER — OXYCODONE HCL 5 MG/5ML PO SOLN: 5.0000 mg | Freq: Once | ORAL | Status: DC | PRN

## 2019-09-08 MED ORDER — EPINEPHRINE PF 1 MG/ML IJ SOLN
INTRAMUSCULAR | Status: DC | PRN
  Administered 2019-09-08: 1 mg

## 2019-09-08 MED ORDER — MIDAZOLAM HCL 2 MG/2ML IJ SOLN
1.0000 mg | Freq: Once | INTRAMUSCULAR | Status: AC
  Administered 2019-09-08: 2 mg via INTRAVENOUS
  Filled 2019-09-08: qty 2

## 2019-09-08 MED ORDER — CHLORHEXIDINE GLUCONATE 4 % EX LIQD: 60.0000 mL | Freq: Once | CUTANEOUS | Status: DC

## 2019-09-08 MED ORDER — FENTANYL CITRATE (PF) 100 MCG/2ML IJ SOLN
50.0000 ug | Freq: Once | INTRAMUSCULAR | Status: AC
  Administered 2019-09-08: 100 ug via INTRAVENOUS
  Filled 2019-09-08: qty 2

## 2019-09-08 MED ORDER — METOPROLOL TARTRATE 5 MG/5ML IV SOLN
INTRAVENOUS | Status: AC
  Filled 2019-09-08: qty 5

## 2019-09-08 MED ORDER — LACTATED RINGERS IV SOLN
INTRAVENOUS | Status: DC
  Administered 2019-09-08: 13:00:00 via INTRAVENOUS

## 2019-09-08 MED ORDER — FENTANYL CITRATE (PF) 100 MCG/2ML IJ SOLN: 25.0000 ug | INTRAMUSCULAR | Status: DC | PRN

## 2019-09-08 MED ORDER — FENTANYL CITRATE (PF) 250 MCG/5ML IJ SOLN
INTRAMUSCULAR | Status: DC | PRN
  Administered 2019-09-08 (×2): 25 ug via INTRAVENOUS

## 2019-09-08 MED ORDER — EPINEPHRINE PF 1 MG/ML IJ SOLN
INTRAMUSCULAR | Status: AC
  Filled 2019-09-08: qty 2

## 2019-09-08 MED ORDER — FENTANYL CITRATE (PF) 250 MCG/5ML IJ SOLN
INTRAMUSCULAR | Status: AC
  Filled 2019-09-08: qty 5

## 2019-09-08 MED ORDER — ROCURONIUM BROMIDE 10 MG/ML (PF) SYRINGE
PREFILLED_SYRINGE | INTRAVENOUS | Status: DC | PRN
  Administered 2019-09-08: 10 mg via INTRAVENOUS

## 2019-09-08 MED ORDER — BUPIVACAINE-EPINEPHRINE 0.5% -1:200000 IJ SOLN
INTRAMUSCULAR | Status: DC | PRN
  Administered 2019-09-08: 15 mL

## 2019-09-08 MED ORDER — CEFAZOLIN SODIUM-DEXTROSE 2-4 GM/100ML-% IV SOLN
2.0000 g | INTRAVENOUS | Status: AC
  Administered 2019-09-08: 2 g via INTRAVENOUS
  Filled 2019-09-08: qty 100

## 2019-09-08 SURGICAL SUPPLY — 42 items
AID PSTN UNV HD RSTRNT DISP (MISCELLANEOUS) ×1
BLADE GREAT WHITE 4.2 (BLADE) ×3 IMPLANT
BLADE GREAT WHITE 4.2MM (BLADE) ×2
BLADE SURG SZ11 CARB STEEL (BLADE) ×3 IMPLANT
BUR OVAL 4.0 (BURR) ×3 IMPLANT
BUR VERTEX HOODED 4.5 (BURR) ×5 IMPLANT
CANNULA SHOULDER 7CM (CANNULA) ×5 IMPLANT
CANNULA TWIST IN 8.25X7CM (CANNULA) ×3 IMPLANT
COVER SURGICAL LIGHT HANDLE (MISCELLANEOUS) ×3 IMPLANT
COVER WAND RF STERILE (DRAPES) IMPLANT
DRAPE SHEET LG 3/4 BI-LAMINATE (DRAPES) ×6 IMPLANT
DRAPE SHOULDER BEACH CHAIR (DRAPES) ×3 IMPLANT
DRAPE SURG 17X11 SM STRL (DRAPES) ×3 IMPLANT
DRAPE U-SHAPE 47X51 STRL (DRAPES) ×3 IMPLANT
DRSG ADAPTIC 3X8 NADH LF (GAUZE/BANDAGES/DRESSINGS) ×3 IMPLANT
DRSG EMULSION OIL 3X3 NADH (GAUZE/BANDAGES/DRESSINGS) ×3 IMPLANT
DRSG PAD ABDOMINAL 8X10 ST (GAUZE/BANDAGES/DRESSINGS) ×3 IMPLANT
DURAPREP 26ML APPLICATOR (WOUND CARE) ×3 IMPLANT
GAUZE SPONGE 4X4 12PLY STRL (GAUZE/BANDAGES/DRESSINGS) ×3 IMPLANT
GLOVE BIO SURGEON STRL SZ8.5 (GLOVE) ×3 IMPLANT
KIT BASIN OR (CUSTOM PROCEDURE TRAY) ×3 IMPLANT
KIT POSITION SHOULDER SCHLEI (MISCELLANEOUS) ×3 IMPLANT
KIT TURNOVER KIT A (KITS) IMPLANT
MANIFOLD NEPTUNE II (INSTRUMENTS) ×3 IMPLANT
NDL SAFETY ECLIPSE 18X1.5 (NEEDLE) ×1 IMPLANT
NDL SPNL 18GX3.5 QUINCKE PK (NEEDLE) ×1 IMPLANT
NEEDLE HYPO 18GX1.5 SHARP (NEEDLE) ×3
NEEDLE SPNL 18GX3.5 QUINCKE PK (NEEDLE) ×3 IMPLANT
PACK SHOULDER (CUSTOM PROCEDURE TRAY) ×3 IMPLANT
PROBE BIPOLAR ATHRO 135MM 90D (MISCELLANEOUS) ×5 IMPLANT
PROTECTOR NERVE ULNAR (MISCELLANEOUS) ×3 IMPLANT
RESTRAINT HEAD UNIVERSAL NS (MISCELLANEOUS) ×3 IMPLANT
SLING ARM IMMOBILIZER LRG (SOFTGOODS) IMPLANT
SLING ARM IMMOBILIZER MED (SOFTGOODS) ×3 IMPLANT
SUT ETHILON 4 0 PS 2 18 (SUTURE) ×3 IMPLANT
SYR 3ML LL SCALE MARK (SYRINGE) ×3 IMPLANT
SYR CONTROL 10ML LL (SYRINGE) ×3 IMPLANT
TAPE CLOTH SURG 4X10 WHT LF (GAUZE/BANDAGES/DRESSINGS) ×2 IMPLANT
TUBING ARTHRO INFLOW-ONLY STRL (TUBING) IMPLANT
TUBING ARTHROSCOPY IRRIG 16FT (MISCELLANEOUS) ×3 IMPLANT
TUBING CONNECTING 10 (TUBING) ×2 IMPLANT
TUBING CONNECTING 10' (TUBING) ×1

## 2019-09-08 NOTE — Progress Notes (Signed)
AssistedDr. Hatchett with left, ultrasound guided, interscalene  block. Side rails up, monitors on throughout procedure. See vital signs in flow sheet. Tolerated Procedure well.  

## 2019-09-08 NOTE — Interval H&P Note (Signed)
History and Physical Interval Note:  09/08/2019 2:08 PM  Margaret Rocha  has presented today for surgery, with the diagnosis of Left Shoulder Rotator Cuff Tear.  The various methods of treatment have been discussed with the patient and family. After consideration of risks, benefits and other options for treatment, the patient has consented to  Procedure(s): Left Shoulder Arthroscopy (Left) as a surgical intervention.  The patient's history has been reviewed, patient examined, no change in status, stable for surgery.  I have reviewed the patient's chart and labs.  Questions were answered to the patient's satisfaction.     Hessie Dibble

## 2019-09-08 NOTE — Anesthesia Procedure Notes (Signed)
Anesthesia Regional Block: Interscalene brachial plexus block   Pre-Anesthetic Checklist: ,, timeout performed, Correct Patient, Correct Site, Correct Laterality, Correct Procedure, Correct Position, site marked, Risks and benefits discussed,  Surgical consent,  Pre-op evaluation,  At surgeon's request and post-op pain management  Laterality: Left and Upper  Prep: chloraprep       Needles:  Injection technique: Single-shot  Needle Type: Echogenic Stimulator Needle     Needle Length: 10cm  Needle Gauge: 21   Needle insertion depth: 2 cm   Additional Needles:   Procedures:,,,, ultrasound used (permanent image in chart),,,,  Narrative:  Start time: 09/08/2019 1:35 PM End time: 09/08/2019 1:45 PM Injection made incrementally with aspirations every 5 mL.  Performed by: Personally  Anesthesiologist: Lyn Hollingshead, MD

## 2019-09-08 NOTE — Brief Op Note (Signed)
Margaret Rocha [id number] 09/08/2019   PRE-OP DIAGNOSIS: left sh RCT and biceps tear  POST-OP DIAGNOSIS: same  PROCEDURE: left sh scope  ANESTHESIA: general and block  Hessie Dibble   Dictation #:  [id number]

## 2019-09-08 NOTE — Anesthesia Preprocedure Evaluation (Signed)
Anesthesia Evaluation  Patient identified by MRN, date of birth, ID band Patient awake    Reviewed: Allergy & Precautions, NPO status , Patient's Chart, lab work & pertinent test results  Airway Mallampati: II       Dental  (+) Teeth Intact   Pulmonary sleep apnea ,    Pulmonary exam normal breath sounds clear to auscultation       Cardiovascular hypertension, Pt. on medications Normal cardiovascular exam Rhythm:Regular Rate:Normal     Neuro/Psych PSYCHIATRIC DISORDERS Depression    GI/Hepatic Neg liver ROS, GERD  Medicated,  Endo/Other  diabetes, Type 2, Oral Hypoglycemic AgentsMorbid obesity  Renal/GU negative Renal ROS  negative genitourinary   Musculoskeletal   Abdominal (+) + obese,   Peds  Hematology negative hematology ROS (+)   Anesthesia Other Findings   Reproductive/Obstetrics                             Anesthesia Physical Anesthesia Plan  ASA: III  Anesthesia Plan: General   Post-op Pain Management:  Regional for Post-op pain   Induction: Intravenous  PONV Risk Score and Plan: 3 and Ondansetron  Airway Management Planned: Oral ETT  Additional Equipment: None  Intra-op Plan:   Post-operative Plan: Extubation in OR  Informed Consent: I have reviewed the patients History and Physical, chart, labs and discussed the procedure including the risks, benefits and alternatives for the proposed anesthesia with the patient or authorized representative who has indicated his/her understanding and acceptance.     Dental advisory given  Plan Discussed with: CRNA  Anesthesia Plan Comments:         Anesthesia Quick Evaluation

## 2019-09-08 NOTE — Progress Notes (Signed)
O2 sats on RA range from 88-90%.  Dr. Linna Caprice anesthesiologist at bedside several times over the last 3 hrs to evaluate patient.  Lung sounds clear, pt denies SOB.  Pt and daughter informed that patient needs to wear CPAP once she gets home for her safety.  Dr. Linna Caprice stated pt was safe for d/c with RA sats from 88-90% with the knowledge that she puts her CPAP on once gets home.  Both patient and daughter aware. D/C instructions gone over with daughter.  No questions at present time.  Informed to call MD's office with any questions or concerns.  Wheeled out to car by [employer], Therapist, sports.

## 2019-09-08 NOTE — Anesthesia Procedure Notes (Signed)
Procedure Name: Intubation Date/Time: 09/08/2019 2:37 PM Performed by: Cynda Familia, CRNA Pre-anesthesia Checklist: Patient identified, Emergency Drugs available, Suction available and Patient being monitored Patient Re-evaluated:Patient Re-evaluated prior to induction Oxygen Delivery Method: Circle System Utilized Preoxygenation: Pre-oxygenation with 100% oxygen Induction Type: IV induction Ventilation: Mask ventilation without difficulty Laryngoscope Size: Miller and 2 Grade View: Grade I Tube type: Oral Number of attempts: 1 Airway Equipment and Method: Stylet Placement Confirmation: ETT inserted through vocal cords under direct vision,  positive ETCO2 and breath sounds checked- equal and bilateral Secured at: 22 cm Tube secured with: Tape Dental Injury: Teeth and Oropharynx as per pre-operative assessment  Comments: Smooth Iv induction Hatchette-- intubation AM CRNA atraumatic-- teeth and mouth as preop-- chipping front and esp lower teeth ( broken) present preop-- all teeth unchanged with laryngoscopy-- bilat BS

## 2019-09-08 NOTE — Transfer of Care (Signed)
Immediate Anesthesia Transfer of Care Note  Patient: Margaret Rocha  Procedure(s) Performed: Left Shoulder Arthroscopy, biceps tenotomy; acromioplasty debridement (Left )  Patient Location: PACU  Anesthesia Type:General  Level of Consciousness: sedated  Airway & Oxygen Therapy: Patient Spontanous Breathing and Patient connected to face mask oxygen  Post-op Assessment: Report given to RN and Post -op Vital signs reviewed and stable  Post vital signs: Reviewed and stable  Last Vitals:  Vitals Value Taken Time  BP 148/62 09/08/19 1545  Temp 36.8 C 09/08/19 1545  Pulse 76 09/08/19 1551  Resp 20 09/08/19 1551  SpO2 96 % 09/08/19 1551  Vitals shown include unvalidated device data.  Last Pain:  Vitals:   09/08/19 1253  TempSrc: Oral  PainSc:       Patients Stated Pain Goal: 3 ([id number])  Complications: No apparent anesthesia complications

## 2019-09-08 NOTE — Op Note (Signed)
NAME: Margaret Rocha, Margaret Rocha MEDICAL RECORD [id number] ACCOUNT [id number] DATE OF BIRTH:23-Dec-1945 FACILITY: [facility] LOCATION: [facility] PHYSICIAN:Jayne Peckenpaugh Autumn Patty, MD  OPERATIVE REPORT  DATE OF PROCEDURE:  09/08/2019  PREOPERATIVE DIAGNOSES: 1.  Left shoulder rotator cuff tear. 2.  Left shoulder biceps degeneration.  POSTOPERATIVE DIAGNOSES: 1.  Left shoulder rotator cuff tear. 2.  Left shoulder biceps degeneration.  PROCEDURE: 1.  Left shoulder arthroscopic debridement. 2.  Left shoulder arthroscopic biceps tenotomy. 3.  Left shoulder arthroscopic acromioplasty.  ANESTHESIA:  General and block.  ATTENDING SURGEON:  Melrose Nakayama, MD  ASSISTANT:  Loni Dolly, PA-C   INDICATIONS FOR PROCEDURE:  The patient is a 73 year old woman who we have followed for many months with a painful left shoulder after an injury.  This has persisted despite physical therapy and injections and other conservative measures.  By MRI scan,  she was noted to have a large retracted rotator cuff tear with muscle atrophy.  She has pain which limits her ability to rest at night and to use her arm, and she describes this pain as extreme.  She is offered arthroscopy.  Informed operative consent  was obtained after discussion of possible complications including reaction to anesthesia and infection.  SUMMARY OF FINDINGS AND PROCEDURE:  Under general anesthesia and a block, a left shoulder arthroscopy was performed in the main operating room.  She did have a massive rotator cuff tear with very poor tissues.  We elected to debride this out of harm's  way.  She was left with the anterior and posterior sleeves of tissue to hopefully control the humeral head.  She did have a very degenerative biceps tendon, which we elected to release with a tenotomy at the level of the glenoid.  She had a moderately  prominent subacromial morphology dressed within a conservative acromioplasty.  She did have some degenerative change about  the humeral head and a chondroplasty was done.  There were no significant loose bodies.  She was scheduled to go home the same day  pending anesthesia clearance.  DESCRIPTION OF PROCEDURE:  The patient was taken to the operating suite where general anesthetic was applied without difficulty.  She was also given a block in the preanesthesia area.  She was positioned in beach-chair position with some difficulty due  to her body habitus.  Eventually, we got her in a nice stable and safe position.  She was then prepped and draped in normal sterile fashion.  After the administration of preoperative IV Kefzol and appropriate timeout, an arthroscopy of the left shoulder  was performed through a total of 3 portals.  Findings were as noted above, and procedure consisted initially of the biceps tenotomy done with the Arthrocare wand.  I then performed a conservative acromioplasty with the bur in the lateral position,  followed by transfer of the bur to the posterior position.  I performed a thorough chondroplasty of some rough areas on the humeral head.  I then performed an extensive debridement of her massive rotator cuff tear, taking this tissue out of harm's way to  the level of the glenoid.  She was left with anterior and posterior sleeves of tissue.  The shoulder was then thoroughly irrigated, followed by removal of arthroscopic equipment.  Simple sutures of nylon were used to loosely reapproximate the portals,  followed by Adaptic, dry gauze, and tape.  Estimated blood loss and intraoperative fluids obtained from anesthesia records.  DISPOSITION:  The patient was extubated in the operating room and taken to recovery room  in stable condition.  Plans were for her to go home the same day and to follow up in the office in less than a week.  I will contact her by phone tonight.  LN/NUANCE  D:09/08/2019 T:09/08/2019 JOB:[id number]

## 2019-09-08 NOTE — Anesthesia Procedure Notes (Signed)
Date/Time: 09/08/2019 3:38 PM Performed by: Cynda Familia, CRNA Oxygen Delivery Method: Simple face mask Placement Confirmation: positive ETCO2 and breath sounds checked- equal and bilateral Dental Injury: Teeth and Oropharynx as per pre-operative assessment

## 2019-09-08 NOTE — Anesthesia Postprocedure Evaluation (Signed)
Anesthesia Post Note  Patient: Margaret Rocha  Procedure(s) Performed: Left Shoulder Arthroscopy, biceps tenotomy; acromioplasty debridement (Left )     Patient location during evaluation: PACU Anesthesia Type: General Level of consciousness: awake and alert Pain management: pain level controlled Vital Signs Assessment: post-procedure vital signs reviewed and stable Respiratory status: spontaneous breathing, nonlabored ventilation and respiratory function stable Cardiovascular status: blood pressure returned to baseline and stable Postop Assessment: no apparent nausea or vomiting Anesthetic complications: no    Last Vitals:  Vitals:   09/08/19 1830 09/08/19 1842  BP: 136/71 135/73  Pulse: 82 83  Resp: 18 16  Temp: 36.9 C 36.8 C  SpO2: (!) 89% 90%    Last Pain:  Vitals:   09/08/19 1842  TempSrc:   PainSc: 0-No pain                 Laurren Lepkowski COKER

## 2019-09-09 ENCOUNTER — Encounter (HOSPITAL_COMMUNITY): Payer: Self-pay | Admitting: Orthopaedic Surgery

## 2019-09-10 NOTE — Addendum Note (Signed)
Addendum  created 09/10/19 1057 by Lyn Hollingshead, MD   Intraprocedure Staff edited

## 2019-09-23 DIAGNOSIS — M75101 Unspecified rotator cuff tear or rupture of right shoulder, not specified as traumatic: Secondary | ICD-10-CM | POA: Diagnosis not present

## 2019-09-23 DIAGNOSIS — S46102S Unspecified injury of muscle, fascia and tendon of long head of biceps, left arm, sequela: Secondary | ICD-10-CM | POA: Diagnosis not present

## 2019-09-23 DIAGNOSIS — M25512 Pain in left shoulder: Secondary | ICD-10-CM | POA: Diagnosis not present

## 2019-09-23 DIAGNOSIS — Z4789 Encounter for other orthopedic aftercare: Secondary | ICD-10-CM | POA: Diagnosis not present

## 2019-09-30 DIAGNOSIS — Z4789 Encounter for other orthopedic aftercare: Secondary | ICD-10-CM | POA: Diagnosis not present

## 2019-09-30 DIAGNOSIS — S46102S Unspecified injury of muscle, fascia and tendon of long head of biceps, left arm, sequela: Secondary | ICD-10-CM | POA: Diagnosis not present

## 2019-09-30 DIAGNOSIS — M75101 Unspecified rotator cuff tear or rupture of right shoulder, not specified as traumatic: Secondary | ICD-10-CM | POA: Diagnosis not present

## 2019-09-30 DIAGNOSIS — M25512 Pain in left shoulder: Secondary | ICD-10-CM | POA: Diagnosis not present

## 2019-10-02 DIAGNOSIS — M25512 Pain in left shoulder: Secondary | ICD-10-CM | POA: Diagnosis not present

## 2019-10-02 DIAGNOSIS — Z4789 Encounter for other orthopedic aftercare: Secondary | ICD-10-CM | POA: Diagnosis not present

## 2019-10-02 DIAGNOSIS — M75101 Unspecified rotator cuff tear or rupture of right shoulder, not specified as traumatic: Secondary | ICD-10-CM | POA: Diagnosis not present

## 2019-10-02 DIAGNOSIS — S46102S Unspecified injury of muscle, fascia and tendon of long head of biceps, left arm, sequela: Secondary | ICD-10-CM | POA: Diagnosis not present

## 2019-10-07 DIAGNOSIS — M75101 Unspecified rotator cuff tear or rupture of right shoulder, not specified as traumatic: Secondary | ICD-10-CM | POA: Diagnosis not present

## 2019-10-07 DIAGNOSIS — Z4789 Encounter for other orthopedic aftercare: Secondary | ICD-10-CM | POA: Diagnosis not present

## 2019-10-07 DIAGNOSIS — S46102S Unspecified injury of muscle, fascia and tendon of long head of biceps, left arm, sequela: Secondary | ICD-10-CM | POA: Diagnosis not present

## 2019-10-07 DIAGNOSIS — M25512 Pain in left shoulder: Secondary | ICD-10-CM | POA: Diagnosis not present

## 2019-10-08 DIAGNOSIS — R2241 Localized swelling, mass and lump, right lower limb: Secondary | ICD-10-CM | POA: Diagnosis not present

## 2019-10-08 DIAGNOSIS — M79651 Pain in right thigh: Secondary | ICD-10-CM | POA: Diagnosis not present

## 2019-10-14 DIAGNOSIS — S46102S Unspecified injury of muscle, fascia and tendon of long head of biceps, left arm, sequela: Secondary | ICD-10-CM | POA: Diagnosis not present

## 2019-10-14 DIAGNOSIS — M25512 Pain in left shoulder: Secondary | ICD-10-CM | POA: Diagnosis not present

## 2019-10-14 DIAGNOSIS — Z4789 Encounter for other orthopedic aftercare: Secondary | ICD-10-CM | POA: Diagnosis not present

## 2019-10-14 DIAGNOSIS — M75101 Unspecified rotator cuff tear or rupture of right shoulder, not specified as traumatic: Secondary | ICD-10-CM | POA: Diagnosis not present

## 2019-10-16 DIAGNOSIS — M25512 Pain in left shoulder: Secondary | ICD-10-CM | POA: Diagnosis not present

## 2019-10-16 DIAGNOSIS — S46102S Unspecified injury of muscle, fascia and tendon of long head of biceps, left arm, sequela: Secondary | ICD-10-CM | POA: Diagnosis not present

## 2019-10-16 DIAGNOSIS — Z4789 Encounter for other orthopedic aftercare: Secondary | ICD-10-CM | POA: Diagnosis not present

## 2019-10-16 DIAGNOSIS — M75101 Unspecified rotator cuff tear or rupture of right shoulder, not specified as traumatic: Secondary | ICD-10-CM | POA: Diagnosis not present

## 2019-10-19 DIAGNOSIS — M25512 Pain in left shoulder: Secondary | ICD-10-CM | POA: Diagnosis not present

## 2019-10-19 DIAGNOSIS — M75101 Unspecified rotator cuff tear or rupture of right shoulder, not specified as traumatic: Secondary | ICD-10-CM | POA: Diagnosis not present

## 2019-10-19 DIAGNOSIS — S46102S Unspecified injury of muscle, fascia and tendon of long head of biceps, left arm, sequela: Secondary | ICD-10-CM | POA: Diagnosis not present

## 2019-10-19 DIAGNOSIS — Z4789 Encounter for other orthopedic aftercare: Secondary | ICD-10-CM | POA: Diagnosis not present

## 2019-10-28 DIAGNOSIS — M75101 Unspecified rotator cuff tear or rupture of right shoulder, not specified as traumatic: Secondary | ICD-10-CM | POA: Diagnosis not present

## 2019-10-28 DIAGNOSIS — M25512 Pain in left shoulder: Secondary | ICD-10-CM | POA: Diagnosis not present

## 2019-10-28 DIAGNOSIS — S46102S Unspecified injury of muscle, fascia and tendon of long head of biceps, left arm, sequela: Secondary | ICD-10-CM | POA: Diagnosis not present

## 2019-10-28 DIAGNOSIS — Z4789 Encounter for other orthopedic aftercare: Secondary | ICD-10-CM | POA: Diagnosis not present

## 2019-10-30 DIAGNOSIS — M25512 Pain in left shoulder: Secondary | ICD-10-CM | POA: Diagnosis not present

## 2019-10-30 DIAGNOSIS — M75101 Unspecified rotator cuff tear or rupture of right shoulder, not specified as traumatic: Secondary | ICD-10-CM | POA: Diagnosis not present

## 2019-10-30 DIAGNOSIS — S46102S Unspecified injury of muscle, fascia and tendon of long head of biceps, left arm, sequela: Secondary | ICD-10-CM | POA: Diagnosis not present

## 2019-10-30 DIAGNOSIS — Z4789 Encounter for other orthopedic aftercare: Secondary | ICD-10-CM | POA: Diagnosis not present

## 2019-11-04 DIAGNOSIS — S46102S Unspecified injury of muscle, fascia and tendon of long head of biceps, left arm, sequela: Secondary | ICD-10-CM | POA: Diagnosis not present

## 2019-11-04 DIAGNOSIS — M75101 Unspecified rotator cuff tear or rupture of right shoulder, not specified as traumatic: Secondary | ICD-10-CM | POA: Diagnosis not present

## 2019-11-04 DIAGNOSIS — M25512 Pain in left shoulder: Secondary | ICD-10-CM | POA: Diagnosis not present

## 2019-11-04 DIAGNOSIS — Z4789 Encounter for other orthopedic aftercare: Secondary | ICD-10-CM | POA: Diagnosis not present

## 2019-11-13 DIAGNOSIS — Z4789 Encounter for other orthopedic aftercare: Secondary | ICD-10-CM | POA: Diagnosis not present

## 2019-11-13 DIAGNOSIS — M75101 Unspecified rotator cuff tear or rupture of right shoulder, not specified as traumatic: Secondary | ICD-10-CM | POA: Diagnosis not present

## 2019-11-13 DIAGNOSIS — M25512 Pain in left shoulder: Secondary | ICD-10-CM | POA: Diagnosis not present

## 2019-11-13 DIAGNOSIS — S46102S Unspecified injury of muscle, fascia and tendon of long head of biceps, left arm, sequela: Secondary | ICD-10-CM | POA: Diagnosis not present

## 2019-11-17 DIAGNOSIS — Z79899 Other long term (current) drug therapy: Secondary | ICD-10-CM | POA: Diagnosis not present

## 2019-11-17 DIAGNOSIS — E785 Hyperlipidemia, unspecified: Secondary | ICD-10-CM | POA: Diagnosis not present

## 2019-11-17 DIAGNOSIS — E1165 Type 2 diabetes mellitus with hyperglycemia: Secondary | ICD-10-CM | POA: Diagnosis not present

## 2019-11-17 DIAGNOSIS — I10 Essential (primary) hypertension: Secondary | ICD-10-CM | POA: Diagnosis not present

## 2019-11-17 DIAGNOSIS — Z23 Encounter for immunization: Secondary | ICD-10-CM | POA: Diagnosis not present

## 2019-11-18 DIAGNOSIS — M25512 Pain in left shoulder: Secondary | ICD-10-CM | POA: Diagnosis not present

## 2019-11-18 DIAGNOSIS — M75101 Unspecified rotator cuff tear or rupture of right shoulder, not specified as traumatic: Secondary | ICD-10-CM | POA: Diagnosis not present

## 2019-11-18 DIAGNOSIS — S46102S Unspecified injury of muscle, fascia and tendon of long head of biceps, left arm, sequela: Secondary | ICD-10-CM | POA: Diagnosis not present

## 2019-11-18 DIAGNOSIS — Z4789 Encounter for other orthopedic aftercare: Secondary | ICD-10-CM | POA: Diagnosis not present

## 2019-11-19 DIAGNOSIS — Z4789 Encounter for other orthopedic aftercare: Secondary | ICD-10-CM | POA: Diagnosis not present

## 2019-11-19 DIAGNOSIS — M25512 Pain in left shoulder: Secondary | ICD-10-CM | POA: Diagnosis not present

## 2019-11-19 DIAGNOSIS — M75101 Unspecified rotator cuff tear or rupture of right shoulder, not specified as traumatic: Secondary | ICD-10-CM | POA: Diagnosis not present

## 2019-11-19 DIAGNOSIS — S46102S Unspecified injury of muscle, fascia and tendon of long head of biceps, left arm, sequela: Secondary | ICD-10-CM | POA: Diagnosis not present

## 2020-02-27 DIAGNOSIS — Z23 Encounter for immunization: Secondary | ICD-10-CM | POA: Diagnosis not present

## 2020-03-08 DIAGNOSIS — E119 Type 2 diabetes mellitus without complications: Secondary | ICD-10-CM | POA: Diagnosis not present

## 2020-03-08 DIAGNOSIS — Z961 Presence of intraocular lens: Secondary | ICD-10-CM | POA: Diagnosis not present

## 2020-03-08 DIAGNOSIS — H353131 Nonexudative age-related macular degeneration, bilateral, early dry stage: Secondary | ICD-10-CM | POA: Diagnosis not present

## 2020-03-22 DIAGNOSIS — N200 Calculus of kidney: Secondary | ICD-10-CM | POA: Diagnosis not present

## 2020-03-22 DIAGNOSIS — N390 Urinary tract infection, site not specified: Secondary | ICD-10-CM | POA: Diagnosis not present

## 2020-03-23 DIAGNOSIS — N200 Calculus of kidney: Secondary | ICD-10-CM | POA: Diagnosis not present

## 2020-03-26 DIAGNOSIS — Z23 Encounter for immunization: Secondary | ICD-10-CM | POA: Diagnosis not present

## 2020-04-13 DIAGNOSIS — R600 Localized edema: Secondary | ICD-10-CM | POA: Diagnosis not present

## 2020-04-13 DIAGNOSIS — Z79899 Other long term (current) drug therapy: Secondary | ICD-10-CM | POA: Diagnosis not present

## 2020-04-13 DIAGNOSIS — G4733 Obstructive sleep apnea (adult) (pediatric): Secondary | ICD-10-CM | POA: Diagnosis not present

## 2020-04-13 DIAGNOSIS — Z7189 Other specified counseling: Secondary | ICD-10-CM | POA: Diagnosis not present

## 2020-04-13 DIAGNOSIS — Z6841 Body Mass Index (BMI) 40.0 and over, adult: Secondary | ICD-10-CM | POA: Diagnosis not present

## 2020-04-13 DIAGNOSIS — Z1331 Encounter for screening for depression: Secondary | ICD-10-CM | POA: Diagnosis not present

## 2020-04-13 DIAGNOSIS — I10 Essential (primary) hypertension: Secondary | ICD-10-CM | POA: Diagnosis not present

## 2020-04-13 DIAGNOSIS — E1165 Type 2 diabetes mellitus with hyperglycemia: Secondary | ICD-10-CM | POA: Diagnosis not present

## 2020-04-13 DIAGNOSIS — E785 Hyperlipidemia, unspecified: Secondary | ICD-10-CM | POA: Diagnosis not present

## 2020-05-12 ENCOUNTER — Ambulatory Visit (INDEPENDENT_AMBULATORY_CARE_PROVIDER_SITE_OTHER): Payer: Medicare Other | Admitting: Sports Medicine

## 2020-05-12 ENCOUNTER — Other Ambulatory Visit: Payer: Self-pay

## 2020-05-12 ENCOUNTER — Encounter: Payer: Self-pay | Admitting: Sports Medicine

## 2020-05-12 DIAGNOSIS — M79674 Pain in right toe(s): Secondary | ICD-10-CM | POA: Diagnosis not present

## 2020-05-12 DIAGNOSIS — M792 Neuralgia and neuritis, unspecified: Secondary | ICD-10-CM

## 2020-05-12 DIAGNOSIS — B351 Tinea unguium: Secondary | ICD-10-CM | POA: Diagnosis not present

## 2020-05-12 MED ORDER — CICLOPIROX 8 % EX SOLN: Freq: Every day | CUTANEOUS | 0 refills | Status: DC

## 2020-05-12 NOTE — Progress Notes (Signed)
Subjective: Margaret Rocha is a 74 y.o. female patient who presents to office for evaluation of right foot pain. Patient reports that she had tingling/stinging sensation to her right second third and fourth toes that was 5 out of 10 a week ago but now on Monday the stinging sensation has stopped reports that she has some stinging still on her right hip but otherwise the previous stinging sensation to her toes have resolved.  Patient reports that she is also concerned about the thickness of her toenails and had previous nail removal in the past but still fragments grow on her left great toe. Denies any other pedal complaints at this time.  Review of systems noncontributory   Patient Active Problem List   Diagnosis Date Noted  . RESTRICTIVE LUNG DISEASE 09/16/2008  . HYPERLIPIDEMIA 08/19/2008  . DEPRESSION 08/19/2008  . CARPAL TUNNEL SYNDROME 08/19/2008  . HYPERTENSION 08/19/2008  . ASTHMA 08/19/2008  . G E R D 08/19/2008  . SPINAL STENOSIS 08/19/2008  . SLEEP APNEA 08/19/2008  . COUGH 08/19/2008    Current Outpatient Medications on File Prior to Visit  Medication Sig Dispense Refill  . acidophilus (RISAQUAD) CAPS capsule Take 1 capsule by mouth daily.    Marland Kitchen amLODipine (NORVASC) 5 MG tablet Take 5 mg by mouth daily.     . APPLE CIDER VINEGAR PO Take 1 capsule by mouth daily.    Marland Kitchen aspirin EC 81 MG tablet Take 81 mg by mouth daily.    Marland Kitchen FOLIC ACID PO Take 1 tablet by mouth daily.    . hydrochlorothiazide (HYDRODIURIL) 25 MG tablet Take 25 mg by mouth daily.    Marland Kitchen HYDROcodone-acetaminophen (NORCO) 5-325 MG tablet Take 1-2 tablets by mouth every 6 (six) hours as needed for moderate pain. 20 tablet 0  . loratadine (CLARITIN) 10 MG tablet Take 10 mg by mouth daily.    Marland Kitchen losartan (COZAAR) 100 MG tablet Take 100 mg by mouth daily.     Marland Kitchen MAGNESIUM-OXIDE 400 (241.3 MG) MG tablet Take 400 mg by mouth 2 (two) times daily.   5  . metFORMIN (GLUCOPHAGE) 500 MG tablet Take 500-1,000 mg by mouth See  admin instructions. Take 500 mg by mouth in the morning and 1000 mg at night    . Omega-3 Fatty Acids (FISH OIL PO) Take 1 capsule by mouth daily.     Marland Kitchen omeprazole (PRILOSEC) 40 MG capsule Take 40 mg by mouth daily.   13  . oxybutynin (DITROPAN) 5 MG tablet Take 5 mg by mouth 2 (two) times daily.   11  . potassium chloride SA (K-DUR) 20 MEQ tablet Take 20 mEq by mouth daily.    . pravastatin (PRAVACHOL) 40 MG tablet Take 40 mg by mouth daily.   7  . venlafaxine XR (EFFEXOR-XR) 75 MG 24 hr capsule Take 75 mg by mouth daily with breakfast.     . vitamin B-12 (CYANOCOBALAMIN) 1000 MCG tablet Take 1,000 mcg by mouth daily.      No current facility-administered medications on file prior to visit.    No Known Allergies  Objective:  General: Alert and oriented x3 in no acute distress  Dermatology: No open lesions bilateral lower extremities, no webspace macerations, no ecchymosis bilateral, all nails x 10 are short and significantly thickened consistent with onychomycosis with mild fragments noted to the left hallux nail bed with evidence of previous nail removal.  Vascular: Dorsalis Pedis and Posterior Tibial pedal pulses palpable 1 out of 4, Capillary Fill Time 5  seconds, scant pedal hair growth bilateral, no edema bilateral lower extremities, Temperature gradient within normal limits.  Neurology: Gross sensation intact via light touch bilateral, resolved stinging sensation to toes 2 3 and 4 on right.  Stinging sensation noted in her hip possible sciatic issues and has a history of bursitis.  Musculoskeletal: No tenderness to palpation to the right toes.  No significant deformity besides fat pad atrophy and very early/minimal hammertoe deformity.    Assessment and Plan: Problem List Items Addressed This Visit    None    Visit Diagnoses    Neuritis    -  Primary   Toe pain, right       Nail fungus       Relevant Medications   ciclopirox (PENLAC) 8 % solution       -Complete  examination performed -Xrays reviewed -Discussed treatement options for now resolved stinging sensation at toes -Advised patient to closely monitor her toes if stinging sensation recurs to try to use her topical pain cream that was prescribed by her PCP to the area to see if this will help if it does not relieve the stinging sensation then return to office -Advised patient that her stinging sensation could be related to sciatica or worsening bursitis in her hip that could be contributing to her foot problem -Advised good supportive shoes daily for foot type -Discussed with patient at length changes to her toenails and recommended home remedy of Vicks VapoRub for Penlac solution as I have prescribed advised patient if this fails to offer results after several months then can consider nail procedure for the severely thickened nails that are bothersome sometimes. -Patient to return to office as needed or sooner if condition worsens.  Landis Martins, DPM

## 2020-09-23 DIAGNOSIS — G510 Bell's palsy: Secondary | ICD-10-CM | POA: Diagnosis not present

## 2020-11-10 DIAGNOSIS — Z7189 Other specified counseling: Secondary | ICD-10-CM | POA: Diagnosis not present

## 2020-11-10 DIAGNOSIS — E785 Hyperlipidemia, unspecified: Secondary | ICD-10-CM | POA: Diagnosis not present

## 2020-11-10 DIAGNOSIS — Z6841 Body Mass Index (BMI) 40.0 and over, adult: Secondary | ICD-10-CM | POA: Diagnosis not present

## 2020-11-10 DIAGNOSIS — Z1331 Encounter for screening for depression: Secondary | ICD-10-CM | POA: Diagnosis not present

## 2020-11-10 DIAGNOSIS — E1165 Type 2 diabetes mellitus with hyperglycemia: Secondary | ICD-10-CM | POA: Diagnosis not present

## 2020-11-10 DIAGNOSIS — I10 Essential (primary) hypertension: Secondary | ICD-10-CM | POA: Diagnosis not present

## 2020-11-10 DIAGNOSIS — Z79899 Other long term (current) drug therapy: Secondary | ICD-10-CM | POA: Diagnosis not present

## 2020-11-10 DIAGNOSIS — Z0000 Encounter for general adult medical examination without abnormal findings: Secondary | ICD-10-CM | POA: Diagnosis not present

## 2020-11-10 DIAGNOSIS — Z1231 Encounter for screening mammogram for malignant neoplasm of breast: Secondary | ICD-10-CM | POA: Diagnosis not present

## 2020-11-10 DIAGNOSIS — Z1339 Encounter for screening examination for other mental health and behavioral disorders: Secondary | ICD-10-CM | POA: Diagnosis not present

## 2020-11-10 DIAGNOSIS — G4733 Obstructive sleep apnea (adult) (pediatric): Secondary | ICD-10-CM | POA: Diagnosis not present

## 2020-11-16 DIAGNOSIS — Z23 Encounter for immunization: Secondary | ICD-10-CM | POA: Diagnosis not present

## 2020-11-22 DIAGNOSIS — N200 Calculus of kidney: Secondary | ICD-10-CM | POA: Diagnosis not present

## 2020-11-22 DIAGNOSIS — N3090 Cystitis, unspecified without hematuria: Secondary | ICD-10-CM | POA: Diagnosis not present

## 2020-11-24 DIAGNOSIS — N390 Urinary tract infection, site not specified: Secondary | ICD-10-CM | POA: Diagnosis not present

## 2020-12-05 DIAGNOSIS — Z20822 Contact with and (suspected) exposure to covid-19: Secondary | ICD-10-CM | POA: Diagnosis not present

## 2020-12-05 DIAGNOSIS — U071 COVID-19: Secondary | ICD-10-CM | POA: Diagnosis not present

## 2021-01-23 DIAGNOSIS — Z23 Encounter for immunization: Secondary | ICD-10-CM | POA: Diagnosis not present

## 2021-02-08 DIAGNOSIS — L820 Inflamed seborrheic keratosis: Secondary | ICD-10-CM | POA: Diagnosis not present

## 2021-02-08 DIAGNOSIS — D225 Melanocytic nevi of trunk: Secondary | ICD-10-CM | POA: Diagnosis not present

## 2021-02-08 DIAGNOSIS — L821 Other seborrheic keratosis: Secondary | ICD-10-CM | POA: Diagnosis not present

## 2021-03-14 DIAGNOSIS — Z1231 Encounter for screening mammogram for malignant neoplasm of breast: Secondary | ICD-10-CM | POA: Diagnosis not present

## 2021-03-14 DIAGNOSIS — L821 Other seborrheic keratosis: Secondary | ICD-10-CM | POA: Diagnosis not present

## 2021-03-14 DIAGNOSIS — Z6841 Body Mass Index (BMI) 40.0 and over, adult: Secondary | ICD-10-CM | POA: Diagnosis not present

## 2021-03-14 DIAGNOSIS — E1165 Type 2 diabetes mellitus with hyperglycemia: Secondary | ICD-10-CM | POA: Diagnosis not present

## 2021-03-14 DIAGNOSIS — Z79899 Other long term (current) drug therapy: Secondary | ICD-10-CM | POA: Diagnosis not present

## 2021-03-14 DIAGNOSIS — E785 Hyperlipidemia, unspecified: Secondary | ICD-10-CM | POA: Diagnosis not present

## 2021-03-14 DIAGNOSIS — G4733 Obstructive sleep apnea (adult) (pediatric): Secondary | ICD-10-CM | POA: Diagnosis not present

## 2021-03-14 DIAGNOSIS — D1721 Benign lipomatous neoplasm of skin and subcutaneous tissue of right arm: Secondary | ICD-10-CM | POA: Diagnosis not present

## 2021-03-14 DIAGNOSIS — I10 Essential (primary) hypertension: Secondary | ICD-10-CM | POA: Diagnosis not present

## 2021-05-10 DIAGNOSIS — Z1231 Encounter for screening mammogram for malignant neoplasm of breast: Secondary | ICD-10-CM | POA: Diagnosis not present

## 2021-05-13 DIAGNOSIS — S90569A Insect bite (nonvenomous), unspecified ankle, initial encounter: Secondary | ICD-10-CM | POA: Diagnosis not present

## 2021-07-14 DIAGNOSIS — Z79899 Other long term (current) drug therapy: Secondary | ICD-10-CM | POA: Diagnosis not present

## 2021-07-14 DIAGNOSIS — I10 Essential (primary) hypertension: Secondary | ICD-10-CM | POA: Diagnosis not present

## 2021-07-14 DIAGNOSIS — G4733 Obstructive sleep apnea (adult) (pediatric): Secondary | ICD-10-CM | POA: Diagnosis not present

## 2021-07-14 DIAGNOSIS — K219 Gastro-esophageal reflux disease without esophagitis: Secondary | ICD-10-CM | POA: Diagnosis not present

## 2021-07-14 DIAGNOSIS — E1165 Type 2 diabetes mellitus with hyperglycemia: Secondary | ICD-10-CM | POA: Diagnosis not present

## 2021-07-14 DIAGNOSIS — Z6841 Body Mass Index (BMI) 40.0 and over, adult: Secondary | ICD-10-CM | POA: Diagnosis not present

## 2021-07-14 DIAGNOSIS — E785 Hyperlipidemia, unspecified: Secondary | ICD-10-CM | POA: Diagnosis not present

## 2021-09-27 DIAGNOSIS — Z23 Encounter for immunization: Secondary | ICD-10-CM | POA: Diagnosis not present

## 2021-11-09 DIAGNOSIS — N3090 Cystitis, unspecified without hematuria: Secondary | ICD-10-CM | POA: Diagnosis not present

## 2021-11-09 DIAGNOSIS — N200 Calculus of kidney: Secondary | ICD-10-CM | POA: Diagnosis not present

## 2022-02-28 DIAGNOSIS — Z7189 Other specified counseling: Secondary | ICD-10-CM | POA: Diagnosis not present

## 2022-02-28 DIAGNOSIS — Z0000 Encounter for general adult medical examination without abnormal findings: Secondary | ICD-10-CM | POA: Diagnosis not present

## 2022-02-28 DIAGNOSIS — F330 Major depressive disorder, recurrent, mild: Secondary | ICD-10-CM | POA: Diagnosis not present

## 2022-02-28 DIAGNOSIS — E785 Hyperlipidemia, unspecified: Secondary | ICD-10-CM | POA: Diagnosis not present

## 2022-02-28 DIAGNOSIS — G4733 Obstructive sleep apnea (adult) (pediatric): Secondary | ICD-10-CM | POA: Diagnosis not present

## 2022-02-28 DIAGNOSIS — E1165 Type 2 diabetes mellitus with hyperglycemia: Secondary | ICD-10-CM | POA: Diagnosis not present

## 2022-02-28 DIAGNOSIS — Z1331 Encounter for screening for depression: Secondary | ICD-10-CM | POA: Diagnosis not present

## 2022-02-28 DIAGNOSIS — Z6841 Body Mass Index (BMI) 40.0 and over, adult: Secondary | ICD-10-CM | POA: Diagnosis not present

## 2022-02-28 DIAGNOSIS — I10 Essential (primary) hypertension: Secondary | ICD-10-CM | POA: Diagnosis not present

## 2022-03-08 DIAGNOSIS — I10 Essential (primary) hypertension: Secondary | ICD-10-CM | POA: Diagnosis not present

## 2022-03-08 DIAGNOSIS — Z79899 Other long term (current) drug therapy: Secondary | ICD-10-CM | POA: Diagnosis not present

## 2022-03-08 DIAGNOSIS — E1165 Type 2 diabetes mellitus with hyperglycemia: Secondary | ICD-10-CM | POA: Diagnosis not present

## 2022-03-08 DIAGNOSIS — E785 Hyperlipidemia, unspecified: Secondary | ICD-10-CM | POA: Diagnosis not present

## 2022-03-21 DIAGNOSIS — H353131 Nonexudative age-related macular degeneration, bilateral, early dry stage: Secondary | ICD-10-CM | POA: Diagnosis not present

## 2022-03-21 DIAGNOSIS — E119 Type 2 diabetes mellitus without complications: Secondary | ICD-10-CM | POA: Diagnosis not present

## 2022-04-10 DIAGNOSIS — G4733 Obstructive sleep apnea (adult) (pediatric): Secondary | ICD-10-CM | POA: Diagnosis not present

## 2022-06-14 DIAGNOSIS — J019 Acute sinusitis, unspecified: Secondary | ICD-10-CM | POA: Diagnosis not present

## 2022-06-14 DIAGNOSIS — J209 Acute bronchitis, unspecified: Secondary | ICD-10-CM | POA: Diagnosis not present

## 2022-07-19 DIAGNOSIS — E559 Vitamin D deficiency, unspecified: Secondary | ICD-10-CM | POA: Diagnosis not present

## 2022-07-19 DIAGNOSIS — Z79899 Other long term (current) drug therapy: Secondary | ICD-10-CM | POA: Diagnosis not present

## 2022-07-19 DIAGNOSIS — E1169 Type 2 diabetes mellitus with other specified complication: Secondary | ICD-10-CM | POA: Diagnosis not present

## 2022-07-19 DIAGNOSIS — I10 Essential (primary) hypertension: Secondary | ICD-10-CM | POA: Diagnosis not present

## 2022-07-19 DIAGNOSIS — N3281 Overactive bladder: Secondary | ICD-10-CM | POA: Diagnosis not present

## 2022-07-19 DIAGNOSIS — E785 Hyperlipidemia, unspecified: Secondary | ICD-10-CM | POA: Diagnosis not present

## 2022-07-19 DIAGNOSIS — Z6841 Body Mass Index (BMI) 40.0 and over, adult: Secondary | ICD-10-CM | POA: Diagnosis not present

## 2022-07-19 DIAGNOSIS — E538 Deficiency of other specified B group vitamins: Secondary | ICD-10-CM | POA: Diagnosis not present

## 2022-07-19 DIAGNOSIS — J309 Allergic rhinitis, unspecified: Secondary | ICD-10-CM | POA: Diagnosis not present

## 2022-07-20 DIAGNOSIS — E1169 Type 2 diabetes mellitus with other specified complication: Secondary | ICD-10-CM | POA: Diagnosis not present

## 2022-07-25 DIAGNOSIS — N3090 Cystitis, unspecified without hematuria: Secondary | ICD-10-CM | POA: Diagnosis not present

## 2022-07-25 DIAGNOSIS — N3281 Overactive bladder: Secondary | ICD-10-CM | POA: Diagnosis not present

## 2022-07-25 DIAGNOSIS — N200 Calculus of kidney: Secondary | ICD-10-CM | POA: Diagnosis not present

## 2022-07-26 DIAGNOSIS — N200 Calculus of kidney: Secondary | ICD-10-CM | POA: Diagnosis not present

## 2022-07-26 DIAGNOSIS — I878 Other specified disorders of veins: Secondary | ICD-10-CM | POA: Diagnosis not present

## 2022-07-26 DIAGNOSIS — Z87442 Personal history of urinary calculi: Secondary | ICD-10-CM | POA: Diagnosis not present

## 2022-07-26 DIAGNOSIS — M47816 Spondylosis without myelopathy or radiculopathy, lumbar region: Secondary | ICD-10-CM | POA: Diagnosis not present

## 2022-09-12 DIAGNOSIS — N390 Urinary tract infection, site not specified: Secondary | ICD-10-CM | POA: Diagnosis not present

## 2022-09-26 DIAGNOSIS — R809 Proteinuria, unspecified: Secondary | ICD-10-CM | POA: Diagnosis not present

## 2022-09-26 DIAGNOSIS — Z1329 Encounter for screening for other suspected endocrine disorder: Secondary | ICD-10-CM | POA: Diagnosis not present

## 2022-09-26 DIAGNOSIS — E559 Vitamin D deficiency, unspecified: Secondary | ICD-10-CM | POA: Diagnosis not present

## 2022-09-26 DIAGNOSIS — R3589 Other polyuria: Secondary | ICD-10-CM | POA: Diagnosis not present

## 2022-09-26 DIAGNOSIS — E119 Type 2 diabetes mellitus without complications: Secondary | ICD-10-CM | POA: Diagnosis not present

## 2022-09-26 DIAGNOSIS — D513 Other dietary vitamin B12 deficiency anemia: Secondary | ICD-10-CM | POA: Diagnosis not present

## 2022-09-26 DIAGNOSIS — Z6841 Body Mass Index (BMI) 40.0 and over, adult: Secondary | ICD-10-CM | POA: Diagnosis not present

## 2022-09-26 DIAGNOSIS — Z7689 Persons encountering health services in other specified circumstances: Secondary | ICD-10-CM | POA: Diagnosis not present

## 2022-09-26 DIAGNOSIS — E785 Hyperlipidemia, unspecified: Secondary | ICD-10-CM | POA: Diagnosis not present

## 2022-10-29 DIAGNOSIS — E1165 Type 2 diabetes mellitus with hyperglycemia: Secondary | ICD-10-CM | POA: Diagnosis not present

## 2022-10-29 DIAGNOSIS — Z1331 Encounter for screening for depression: Secondary | ICD-10-CM | POA: Diagnosis not present

## 2022-10-29 DIAGNOSIS — E559 Vitamin D deficiency, unspecified: Secondary | ICD-10-CM | POA: Diagnosis not present

## 2022-10-29 DIAGNOSIS — Z0000 Encounter for general adult medical examination without abnormal findings: Secondary | ICD-10-CM | POA: Diagnosis not present

## 2022-10-29 DIAGNOSIS — Z713 Dietary counseling and surveillance: Secondary | ICD-10-CM | POA: Diagnosis not present

## 2022-10-29 DIAGNOSIS — Z23 Encounter for immunization: Secondary | ICD-10-CM | POA: Diagnosis not present

## 2022-10-29 DIAGNOSIS — Z789 Other specified health status: Secondary | ICD-10-CM | POA: Diagnosis not present

## 2022-10-29 DIAGNOSIS — Z6838 Body mass index (BMI) 38.0-38.9, adult: Secondary | ICD-10-CM | POA: Diagnosis not present

## 2022-10-29 DIAGNOSIS — R251 Tremor, unspecified: Secondary | ICD-10-CM | POA: Diagnosis not present

## 2022-10-29 DIAGNOSIS — R799 Abnormal finding of blood chemistry, unspecified: Secondary | ICD-10-CM | POA: Diagnosis not present

## 2022-10-29 DIAGNOSIS — E785 Hyperlipidemia, unspecified: Secondary | ICD-10-CM | POA: Diagnosis not present

## 2022-11-04 DIAGNOSIS — E119 Type 2 diabetes mellitus without complications: Secondary | ICD-10-CM | POA: Diagnosis not present

## 2022-11-04 DIAGNOSIS — J209 Acute bronchitis, unspecified: Secondary | ICD-10-CM | POA: Diagnosis not present

## 2022-11-04 DIAGNOSIS — J069 Acute upper respiratory infection, unspecified: Secondary | ICD-10-CM | POA: Diagnosis not present

## 2022-11-26 DIAGNOSIS — N390 Urinary tract infection, site not specified: Secondary | ICD-10-CM | POA: Diagnosis not present

## 2022-11-26 DIAGNOSIS — N3281 Overactive bladder: Secondary | ICD-10-CM | POA: Diagnosis not present

## 2022-11-26 DIAGNOSIS — N200 Calculus of kidney: Secondary | ICD-10-CM | POA: Diagnosis not present

## 2022-12-28 DIAGNOSIS — E785 Hyperlipidemia, unspecified: Secondary | ICD-10-CM | POA: Diagnosis not present

## 2022-12-28 DIAGNOSIS — R251 Tremor, unspecified: Secondary | ICD-10-CM | POA: Diagnosis not present

## 2022-12-28 DIAGNOSIS — Z6839 Body mass index (BMI) 39.0-39.9, adult: Secondary | ICD-10-CM | POA: Diagnosis not present

## 2022-12-28 DIAGNOSIS — I10 Essential (primary) hypertension: Secondary | ICD-10-CM | POA: Diagnosis not present

## 2022-12-28 DIAGNOSIS — E1165 Type 2 diabetes mellitus with hyperglycemia: Secondary | ICD-10-CM | POA: Diagnosis not present

## 2022-12-28 DIAGNOSIS — E559 Vitamin D deficiency, unspecified: Secondary | ICD-10-CM | POA: Diagnosis not present

## 2023-01-14 DIAGNOSIS — N390 Urinary tract infection, site not specified: Secondary | ICD-10-CM | POA: Diagnosis not present

## 2023-01-14 DIAGNOSIS — N3281 Overactive bladder: Secondary | ICD-10-CM | POA: Diagnosis not present

## 2023-01-14 DIAGNOSIS — N200 Calculus of kidney: Secondary | ICD-10-CM | POA: Diagnosis not present

## 2023-03-28 DIAGNOSIS — E785 Hyperlipidemia, unspecified: Secondary | ICD-10-CM | POA: Diagnosis not present

## 2023-03-28 DIAGNOSIS — Z0000 Encounter for general adult medical examination without abnormal findings: Secondary | ICD-10-CM | POA: Diagnosis not present

## 2023-03-28 DIAGNOSIS — R609 Edema, unspecified: Secondary | ICD-10-CM | POA: Diagnosis not present

## 2023-03-28 DIAGNOSIS — E559 Vitamin D deficiency, unspecified: Secondary | ICD-10-CM | POA: Diagnosis not present

## 2023-03-28 DIAGNOSIS — Z789 Other specified health status: Secondary | ICD-10-CM | POA: Diagnosis not present

## 2023-03-28 DIAGNOSIS — E1165 Type 2 diabetes mellitus with hyperglycemia: Secondary | ICD-10-CM | POA: Diagnosis not present

## 2023-03-28 DIAGNOSIS — Z1331 Encounter for screening for depression: Secondary | ICD-10-CM | POA: Diagnosis not present

## 2023-03-28 DIAGNOSIS — I10 Essential (primary) hypertension: Secondary | ICD-10-CM | POA: Diagnosis not present

## 2023-03-28 DIAGNOSIS — Z6841 Body Mass Index (BMI) 40.0 and over, adult: Secondary | ICD-10-CM | POA: Diagnosis not present

## 2023-04-23 DIAGNOSIS — Z79899 Other long term (current) drug therapy: Secondary | ICD-10-CM | POA: Diagnosis not present

## 2023-04-23 DIAGNOSIS — R609 Edema, unspecified: Secondary | ICD-10-CM | POA: Diagnosis not present

## 2023-04-23 DIAGNOSIS — E785 Hyperlipidemia, unspecified: Secondary | ICD-10-CM | POA: Diagnosis not present

## 2023-04-23 DIAGNOSIS — E1165 Type 2 diabetes mellitus with hyperglycemia: Secondary | ICD-10-CM | POA: Diagnosis not present

## 2023-04-23 DIAGNOSIS — Z6841 Body Mass Index (BMI) 40.0 and over, adult: Secondary | ICD-10-CM | POA: Diagnosis not present

## 2023-07-25 DIAGNOSIS — R609 Edema, unspecified: Secondary | ICD-10-CM | POA: Diagnosis not present

## 2023-07-25 DIAGNOSIS — Z79899 Other long term (current) drug therapy: Secondary | ICD-10-CM | POA: Diagnosis not present

## 2023-07-25 DIAGNOSIS — E785 Hyperlipidemia, unspecified: Secondary | ICD-10-CM | POA: Diagnosis not present

## 2023-07-25 DIAGNOSIS — E1165 Type 2 diabetes mellitus with hyperglycemia: Secondary | ICD-10-CM | POA: Diagnosis not present

## 2023-07-25 DIAGNOSIS — Z6841 Body Mass Index (BMI) 40.0 and over, adult: Secondary | ICD-10-CM | POA: Diagnosis not present

## 2023-08-13 DIAGNOSIS — J209 Acute bronchitis, unspecified: Secondary | ICD-10-CM | POA: Diagnosis not present

## 2023-08-13 DIAGNOSIS — R051 Acute cough: Secondary | ICD-10-CM | POA: Diagnosis not present

## 2023-08-13 DIAGNOSIS — R0981 Nasal congestion: Secondary | ICD-10-CM | POA: Diagnosis not present

## 2023-08-13 DIAGNOSIS — J019 Acute sinusitis, unspecified: Secondary | ICD-10-CM | POA: Diagnosis not present

## 2023-08-22 DIAGNOSIS — R0981 Nasal congestion: Secondary | ICD-10-CM | POA: Diagnosis not present

## 2023-08-22 DIAGNOSIS — R059 Cough, unspecified: Secondary | ICD-10-CM | POA: Diagnosis not present

## 2023-08-22 DIAGNOSIS — J189 Pneumonia, unspecified organism: Secondary | ICD-10-CM | POA: Diagnosis not present

## 2023-08-26 DIAGNOSIS — N2889 Other specified disorders of kidney and ureter: Secondary | ICD-10-CM | POA: Diagnosis not present

## 2023-08-26 DIAGNOSIS — N200 Calculus of kidney: Secondary | ICD-10-CM | POA: Diagnosis not present

## 2023-08-28 DIAGNOSIS — N3281 Overactive bladder: Secondary | ICD-10-CM | POA: Diagnosis not present

## 2023-08-28 DIAGNOSIS — N390 Urinary tract infection, site not specified: Secondary | ICD-10-CM | POA: Diagnosis not present

## 2023-08-28 DIAGNOSIS — N200 Calculus of kidney: Secondary | ICD-10-CM | POA: Diagnosis not present

## 2023-09-25 DIAGNOSIS — N390 Urinary tract infection, site not specified: Secondary | ICD-10-CM | POA: Diagnosis not present

## 2023-09-25 DIAGNOSIS — N200 Calculus of kidney: Secondary | ICD-10-CM | POA: Diagnosis not present

## 2023-09-25 DIAGNOSIS — N3281 Overactive bladder: Secondary | ICD-10-CM | POA: Diagnosis not present

## 2023-11-13 DIAGNOSIS — Z23 Encounter for immunization: Secondary | ICD-10-CM | POA: Diagnosis not present

## 2023-11-18 DIAGNOSIS — N200 Calculus of kidney: Secondary | ICD-10-CM | POA: Diagnosis not present

## 2023-11-18 DIAGNOSIS — N3281 Overactive bladder: Secondary | ICD-10-CM | POA: Diagnosis not present

## 2023-11-18 DIAGNOSIS — N390 Urinary tract infection, site not specified: Secondary | ICD-10-CM | POA: Diagnosis not present

## 2023-11-28 DIAGNOSIS — R809 Proteinuria, unspecified: Secondary | ICD-10-CM | POA: Diagnosis not present

## 2023-11-28 DIAGNOSIS — R609 Edema, unspecified: Secondary | ICD-10-CM | POA: Diagnosis not present

## 2023-11-28 DIAGNOSIS — Z79899 Other long term (current) drug therapy: Secondary | ICD-10-CM | POA: Diagnosis not present

## 2023-11-28 DIAGNOSIS — Z23 Encounter for immunization: Secondary | ICD-10-CM | POA: Diagnosis not present

## 2023-11-28 DIAGNOSIS — E1165 Type 2 diabetes mellitus with hyperglycemia: Secondary | ICD-10-CM | POA: Diagnosis not present

## 2023-11-28 DIAGNOSIS — Z6839 Body mass index (BMI) 39.0-39.9, adult: Secondary | ICD-10-CM | POA: Diagnosis not present

## 2023-11-28 DIAGNOSIS — E785 Hyperlipidemia, unspecified: Secondary | ICD-10-CM | POA: Diagnosis not present

## 2023-11-28 DIAGNOSIS — Z1231 Encounter for screening mammogram for malignant neoplasm of breast: Secondary | ICD-10-CM | POA: Diagnosis not present

## 2023-11-28 DIAGNOSIS — Z0000 Encounter for general adult medical examination without abnormal findings: Secondary | ICD-10-CM | POA: Diagnosis not present

## 2023-11-28 DIAGNOSIS — Z6841 Body Mass Index (BMI) 40.0 and over, adult: Secondary | ICD-10-CM | POA: Diagnosis not present

## 2024-05-07 ENCOUNTER — Inpatient Hospital Stay (HOSPITAL_COMMUNITY)
Admission: EM | Admit: 2024-05-07 | Discharge: 2024-05-09 | DRG: 871 | Disposition: A | Discharge status: Home Health | Attending: Student | Admitting: Student

## 2024-05-07 ENCOUNTER — Emergency Department (HOSPITAL_COMMUNITY)

## 2024-05-07 ENCOUNTER — Other Ambulatory Visit: Payer: Self-pay

## 2024-05-07 DIAGNOSIS — E1165 Type 2 diabetes mellitus with hyperglycemia: Secondary | ICD-10-CM | POA: Diagnosis present

## 2024-05-07 DIAGNOSIS — R6520 Severe sepsis without septic shock: Secondary | ICD-10-CM | POA: Diagnosis present

## 2024-05-07 DIAGNOSIS — E872 Acidosis, unspecified: Secondary | ICD-10-CM | POA: Diagnosis present

## 2024-05-07 DIAGNOSIS — Z79899 Other long term (current) drug therapy: Secondary | ICD-10-CM

## 2024-05-07 DIAGNOSIS — Z7984 Long term (current) use of oral hypoglycemic drugs: Secondary | ICD-10-CM | POA: Diagnosis not present

## 2024-05-07 DIAGNOSIS — G250 Essential tremor: Secondary | ICD-10-CM | POA: Diagnosis present

## 2024-05-07 DIAGNOSIS — A419 Sepsis, unspecified organism: Secondary | ICD-10-CM | POA: Diagnosis present

## 2024-05-07 DIAGNOSIS — Z7982 Long term (current) use of aspirin: Secondary | ICD-10-CM | POA: Diagnosis not present

## 2024-05-07 DIAGNOSIS — Z91199 Patient's noncompliance with other medical treatment and regimen due to unspecified reason: Secondary | ICD-10-CM | POA: Diagnosis not present

## 2024-05-07 DIAGNOSIS — G4733 Obstructive sleep apnea (adult) (pediatric): Secondary | ICD-10-CM | POA: Diagnosis present

## 2024-05-07 DIAGNOSIS — Z66 Do not resuscitate: Secondary | ICD-10-CM | POA: Diagnosis present

## 2024-05-07 DIAGNOSIS — I10 Essential (primary) hypertension: Secondary | ICD-10-CM | POA: Diagnosis present

## 2024-05-07 DIAGNOSIS — J9601 Acute respiratory failure with hypoxia: Secondary | ICD-10-CM | POA: Diagnosis present

## 2024-05-07 DIAGNOSIS — R410 Disorientation, unspecified: Secondary | ICD-10-CM

## 2024-05-07 DIAGNOSIS — K219 Gastro-esophageal reflux disease without esophagitis: Secondary | ICD-10-CM | POA: Diagnosis present

## 2024-05-07 DIAGNOSIS — E785 Hyperlipidemia, unspecified: Secondary | ICD-10-CM | POA: Diagnosis present

## 2024-05-07 DIAGNOSIS — G9341 Metabolic encephalopathy: Secondary | ICD-10-CM | POA: Diagnosis present

## 2024-05-07 DIAGNOSIS — Z90710 Acquired absence of both cervix and uterus: Secondary | ICD-10-CM

## 2024-05-07 DIAGNOSIS — J189 Pneumonia, unspecified organism: Secondary | ICD-10-CM | POA: Diagnosis not present

## 2024-05-07 DIAGNOSIS — G4730 Sleep apnea, unspecified: Secondary | ICD-10-CM | POA: Diagnosis present

## 2024-05-07 DIAGNOSIS — R739 Hyperglycemia, unspecified: Secondary | ICD-10-CM

## 2024-05-07 LAB — PROTIME-INR
INR: 1.1 (ref 0.8–1.2)
Prothrombin Time: 13.9 s (ref 11.4–15.2)

## 2024-05-07 LAB — COMPREHENSIVE METABOLIC PANEL WITH GFR
ALT: 25 U/L (ref 0–44)
AST: 32 U/L (ref 15–41)
Albumin: 3.7 g/dL (ref 3.5–5.0)
Alkaline Phosphatase: 72 U/L (ref 38–126)
Anion gap: 9 (ref 5–15)
BUN: 8 mg/dL (ref 8–23)
CO2: 25 mmol/L (ref 22–32)
Calcium: 9.4 mg/dL (ref 8.9–10.3)
Chloride: 105 mmol/L (ref 98–111)
Creatinine, Ser: 0.68 mg/dL (ref 0.44–1.00)
GFR, Estimated: >60 mL/min (ref >60)
Glucose, Bld: 176 mg/dL — ABNORMAL HIGH (ref 70–99)
Potassium: 4 mmol/L (ref 3.5–5.1)
Sodium: 139 mmol/L (ref 135–145)
Total Bilirubin: 0.9 mg/dL (ref 0.0–1.2)
Total Protein: 6.4 g/dL — ABNORMAL LOW (ref 6.5–8.1)

## 2024-05-07 LAB — URINALYSIS, W/ REFLEX TO CULTURE (INFECTION SUSPECTED)
Bilirubin Urine: NEGATIVE
Glucose, UA: NEGATIVE mg/dL
Hgb urine dipstick: NEGATIVE
Ketones, ur: NEGATIVE mg/dL
Leukocytes,Ua: NEGATIVE
Nitrite: POSITIVE — AB
Protein, ur: 30 mg/dL — AB
Specific Gravity, Urine: 1.011 (ref 1.005–1.030)
pH: 7 (ref 5.0–8.0)

## 2024-05-07 LAB — CBC WITH DIFFERENTIAL/PLATELET
Abs Immature Granulocytes: 0.05 10*3/uL (ref 0.00–0.07)
Basophils Absolute: 0.1 10*3/uL (ref 0.0–0.1)
Basophils Relative: 1 %
Eosinophils Absolute: 0.2 10*3/uL (ref 0.0–0.5)
Eosinophils Relative: 2 %
HCT: 43.2 % (ref 36.0–46.0)
Hemoglobin: 14.3 g/dL (ref 12.0–15.0)
Immature Granulocytes: 1 %
Lymphocytes Relative: 22 %
Lymphs Abs: 2.5 10*3/uL (ref 0.7–4.0)
MCH: 30.6 pg (ref 26.0–34.0)
MCHC: 33.1 g/dL (ref 30.0–36.0)
MCV: 92.5 fL (ref 80.0–100.0)
Monocytes Absolute: 0.6 10*3/uL (ref 0.1–1.0)
Monocytes Relative: 5 %
Neutro Abs: 7.7 10*3/uL (ref 1.7–7.7)
Neutrophils Relative %: 69 %
Platelets: 223 10*3/uL (ref 150–400)
RBC: 4.67 MIL/uL (ref 3.87–5.11)
RDW: 13.3 % (ref 11.5–15.5)
WBC: 11 10*3/uL — ABNORMAL HIGH (ref 4.0–10.5)
nRBC: 0 % (ref 0.0–0.2)

## 2024-05-07 LAB — GLUCOSE, CAPILLARY
Glucose-Capillary: 166 mg/dL — ABNORMAL HIGH (ref 70–99)
Glucose-Capillary: 129 mg/dL — ABNORMAL HIGH (ref 70–99)
Glucose-Capillary: 192 mg/dL — ABNORMAL HIGH (ref 70–99)
Glucose-Capillary: 126 mg/dL — ABNORMAL HIGH (ref 70–99)

## 2024-05-07 LAB — I-STAT CG4 LACTIC ACID, ED: Lactic Acid, Venous: 2.1 mmol/L (ref 0.5–1.9)

## 2024-05-07 LAB — MAGNESIUM: Magnesium: 1.4 mg/dL — ABNORMAL LOW (ref 1.7–2.4)

## 2024-05-07 LAB — PHOSPHORUS: Phosphorus: 3.3 mg/dL (ref 2.5–4.6)

## 2024-05-07 LAB — HEMOGLOBIN A1C
Hgb A1c MFr Bld: 6.5 % — ABNORMAL HIGH (ref 4.8–5.6)
Mean Plasma Glucose: 139.85 mg/dL

## 2024-05-07 LAB — TSH: TSH: 2.375 u[IU]/mL (ref 0.350–4.500)

## 2024-05-07 LAB — STREP PNEUMONIAE URINARY ANTIGEN: Strep Pneumo Urinary Antigen: NEGATIVE

## 2024-05-07 LAB — VITAMIN B12: Vitamin B-12: 277 pg/mL (ref 180–914)

## 2024-05-07 MED ORDER — AMLODIPINE BESYLATE 2.5 MG PO TABS: 2.5000 mg | ORAL_TABLET | Freq: Every day | ORAL | Status: DC

## 2024-05-07 MED ORDER — SODIUM CHLORIDE 0.9 % IV SOLN
1.0000 g | Freq: Once | INTRAVENOUS | Status: AC
  Administered 2024-05-07: 1 g via INTRAVENOUS
  Filled 2024-05-07: qty 10

## 2024-05-07 MED ORDER — ONDANSETRON HCL 4 MG PO TABS
4.0000 mg | ORAL_TABLET | Freq: Four times a day (QID) | ORAL | Status: DC | PRN
  Filled 2024-05-07: qty 1

## 2024-05-07 MED ORDER — PRAVASTATIN SODIUM 40 MG PO TABS
80.0000 mg | ORAL_TABLET | Freq: Every day | ORAL | Status: DC
  Administered 2024-05-07 – 2024-05-09 (×3): 80 mg via ORAL
  Filled 2024-05-07 (×4): qty 2

## 2024-05-07 MED ORDER — INSULIN ASPART 100 UNIT/ML IJ SOLN
0.0000 [IU] | Freq: Three times a day (TID) | INTRAMUSCULAR | Status: DC
  Administered 2024-05-07: 2 [IU] via SUBCUTANEOUS

## 2024-05-07 MED ORDER — AMLODIPINE BESYLATE 5 MG PO TABS
5.0000 mg | ORAL_TABLET | Freq: Every day | ORAL | Status: DC
  Administered 2024-05-07: 5 mg via ORAL
  Filled 2024-05-07 (×2): qty 1

## 2024-05-07 MED ORDER — GLIPIZIDE ER 2.5 MG PO TB24
2.5000 mg | ORAL_TABLET | Freq: Every day | ORAL | Status: DC
  Filled 2024-05-07: qty 1

## 2024-05-07 MED ORDER — PROCHLORPERAZINE EDISYLATE 10 MG/2ML IJ SOLN: 5.0000 mg | Freq: Four times a day (QID) | INTRAMUSCULAR | Status: DC | PRN

## 2024-05-07 MED ORDER — SODIUM CHLORIDE 0.9% FLUSH
3.0000 mL | Freq: Two times a day (BID) | INTRAVENOUS | Status: DC
  Administered 2024-05-07 – 2024-05-09 (×5): 3 mL via INTRAVENOUS

## 2024-05-07 MED ORDER — IPRATROPIUM-ALBUTEROL 0.5-2.5 (3) MG/3ML IN SOLN
3.0000 mL | Freq: Three times a day (TID) | RESPIRATORY_TRACT | Status: DC
  Administered 2024-05-08 – 2024-05-09 (×4): 3 mL via RESPIRATORY_TRACT
  Filled 2024-05-07 (×4): qty 3

## 2024-05-07 MED ORDER — ACETAMINOPHEN 325 MG PO TABS
650.0000 mg | ORAL_TABLET | Freq: Four times a day (QID) | ORAL | Status: DC | PRN
  Administered 2024-05-08: 650 mg via ORAL
  Filled 2024-05-07: qty 2

## 2024-05-07 MED ORDER — SODIUM CHLORIDE 0.9 % IV BOLUS (SEPSIS)
1000.0000 mL | Freq: Once | INTRAVENOUS | Status: AC
  Administered 2024-05-07: 1000 mL via INTRAVENOUS

## 2024-05-07 MED ORDER — LOSARTAN POTASSIUM 50 MG PO TABS
100.0000 mg | ORAL_TABLET | Freq: Every day | ORAL | Status: DC
  Administered 2024-05-07 – 2024-05-09 (×3): 100 mg via ORAL
  Filled 2024-05-07 (×4): qty 2

## 2024-05-07 MED ORDER — SODIUM CHLORIDE 0.9 % IV SOLN: 250.0000 mL | INTRAVENOUS | Status: AC | PRN

## 2024-05-07 MED ORDER — RISAQUAD PO CAPS
1.0000 | ORAL_CAPSULE | Freq: Every day | ORAL | Status: DC
  Administered 2024-05-07 – 2024-05-09 (×3): 1 via ORAL
  Filled 2024-05-07 (×4): qty 1

## 2024-05-07 MED ORDER — AMLODIPINE BESYLATE 2.5 MG PO TABS
2.5000 mg | ORAL_TABLET | ORAL | Status: AC
  Administered 2024-05-07: 2.5 mg via ORAL
  Filled 2024-05-07 (×2): qty 1

## 2024-05-07 MED ORDER — MELATONIN 5 MG PO TABS
5.0000 mg | ORAL_TABLET | Freq: Every evening | ORAL | Status: DC | PRN
  Filled 2024-05-07: qty 1

## 2024-05-07 MED ORDER — MAGNESIUM SULFATE 2 GM/50ML IV SOLN
2.0000 g | Freq: Once | INTRAVENOUS | Status: AC
  Administered 2024-05-07: 2 g via INTRAVENOUS
  Filled 2024-05-07: qty 50

## 2024-05-07 MED ORDER — HYDRALAZINE HCL 20 MG/ML IJ SOLN: 5.0000 mg | INTRAMUSCULAR | Status: DC | PRN

## 2024-05-07 MED ORDER — FLUTICASONE PROPIONATE 50 MCG/ACT NA SUSP
1.0000 | Freq: Every day | NASAL | Status: DC
  Administered 2024-05-07 – 2024-05-09 (×3): 1 via NASAL
  Filled 2024-05-07: qty 16

## 2024-05-07 MED ORDER — SODIUM CHLORIDE 0.9 % IV SOLN
2.0000 g | INTRAVENOUS | Status: DC
  Administered 2024-05-08: 2 g via INTRAVENOUS
  Filled 2024-05-07 (×2): qty 20

## 2024-05-07 MED ORDER — PANTOPRAZOLE SODIUM 40 MG PO TBEC
40.0000 mg | DELAYED_RELEASE_TABLET | Freq: Every day | ORAL | Status: DC
  Administered 2024-05-07 – 2024-05-09 (×3): 40 mg via ORAL
  Filled 2024-05-07 (×4): qty 1

## 2024-05-07 MED ORDER — GUAIFENESIN-DM 100-10 MG/5ML PO SYRP
5.0000 mL | ORAL_SOLUTION | ORAL | Status: DC | PRN
  Filled 2024-05-07 (×6): qty 5

## 2024-05-07 MED ORDER — AMLODIPINE BESYLATE 10 MG PO TABS: 10.0000 mg | ORAL_TABLET | Freq: Every day | ORAL | Status: DC

## 2024-05-07 MED ORDER — ALBUTEROL SULFATE (2.5 MG/3ML) 0.083% IN NEBU: 2.5000 mg | INHALATION_SOLUTION | RESPIRATORY_TRACT | Status: DC | PRN

## 2024-05-07 MED ORDER — ASPIRIN 81 MG PO CHEW
81.0000 mg | CHEWABLE_TABLET | Freq: Every day | ORAL | Status: DC
  Administered 2024-05-07 – 2024-05-09 (×3): 81 mg via ORAL
  Filled 2024-05-07 (×4): qty 1

## 2024-05-07 MED ORDER — SODIUM CHLORIDE 0.9 % IV SOLN: 1.0000 g | INTRAVENOUS | Status: DC

## 2024-05-07 MED ORDER — ONDANSETRON HCL 4 MG/2ML IJ SOLN: 4.0000 mg | Freq: Four times a day (QID) | INTRAMUSCULAR | Status: DC | PRN

## 2024-05-07 MED ORDER — AMLODIPINE BESYLATE 10 MG PO TABS
10.0000 mg | ORAL_TABLET | Freq: Every day | ORAL | Status: DC
  Administered 2024-05-08 – 2024-05-09 (×2): 10 mg via ORAL
  Filled 2024-05-07 (×3): qty 1

## 2024-05-07 MED ORDER — SODIUM CHLORIDE 0.9% FLUSH
3.0000 mL | INTRAVENOUS | Status: DC | PRN
Start: 2024-05-07 — End: 2024-05-09

## 2024-05-07 MED ORDER — ENOXAPARIN SODIUM 40 MG/0.4ML IJ SOSY
40.0000 mg | PREFILLED_SYRINGE | Freq: Every day | INTRAMUSCULAR | Status: DC
  Administered 2024-05-07: 40 mg via SUBCUTANEOUS
  Filled 2024-05-07 (×2): qty 0.4

## 2024-05-07 MED ORDER — SODIUM CHLORIDE 0.9 % IV SOLN
500.0000 mg | Freq: Once | INTRAVENOUS | Status: AC
  Administered 2024-05-07: 500 mg via INTRAVENOUS
  Filled 2024-05-07: qty 5

## 2024-05-07 MED ORDER — GLIPIZIDE ER 2.5 MG PO TB24
2.5000 mg | ORAL_TABLET | Freq: Every day | ORAL | Status: DC
  Administered 2024-05-08 – 2024-05-09 (×2): 2.5 mg via ORAL
  Filled 2024-05-07 (×3): qty 1

## 2024-05-07 MED ORDER — SODIUM CHLORIDE 0.9 % IV SOLN: 500.0000 mg | INTRAVENOUS | Status: DC

## 2024-05-07 MED ORDER — AZITHROMYCIN 500 MG PO TABS
500.0000 mg | ORAL_TABLET | Freq: Every day | ORAL | Status: DC
Start: 2024-05-08 — End: 2024-05-09
  Administered 2024-05-08: 500 mg via ORAL
  Filled 2024-05-07: qty 2
  Filled 2024-05-07 (×2): qty 1

## 2024-05-07 MED ORDER — INSULIN ASPART 100 UNIT/ML IJ SOLN: 0.0000 [IU] | Freq: Every day | INTRAMUSCULAR | Status: DC

## 2024-05-07 MED ORDER — POLYETHYLENE GLYCOL 3350 17 G PO PACK
17.0000 g | PACK | Freq: Every day | ORAL | Status: DC | PRN
  Filled 2024-05-07: qty 1

## 2024-05-07 MED ORDER — ENOXAPARIN SODIUM 40 MG/0.4ML IJ SOSY
40.0000 mg | PREFILLED_SYRINGE | INTRAMUSCULAR | Status: DC
  Administered 2024-05-08 – 2024-05-09 (×2): 40 mg via SUBCUTANEOUS
  Filled 2024-05-07 (×4): qty 0.4

## 2024-05-07 MED ORDER — IPRATROPIUM-ALBUTEROL 0.5-2.5 (3) MG/3ML IN SOLN
3.0000 mL | Freq: Four times a day (QID) | RESPIRATORY_TRACT | Status: DC
  Administered 2024-05-07 (×3): 3 mL via RESPIRATORY_TRACT
  Filled 2024-05-07 (×7): qty 3

## 2024-05-07 NOTE — H&P (Signed)
 [facility] H&P   Patient Demographics:    Margaret Rocha, is a 78 y.o. female  MRN: [id number]   DOB - 03-24-1946  Admit Date - 05/07/2024  Outpatient Primary MD for the patient is Olan Bering, MD  Referring MD/NP/PA: Dr. Wallis Gun  Patient coming from: Home  Chief Complaint  Patient presents with   Altered Mental Status      HPI:    Margaret Rocha  is a 78 y.o. female, with past medical history of diabetes mellitus, hypertension, hyperlipidemia, obstructive sleep apnea, noncompliant with CPAP, recent hospitalization at [facility] March 2025, secondary to influenza pneumonia/colitis, she was discharged home with home oxygen, but weaned off oxygen after month, she presents to ED secondary to complaints of confusion and altered mental status, daughter at bedside assists with the history, apparently yesterday patient became more confused, altered, with poor appetite and generalized weakness, otherwise patient denies any other specific complaints. - In ED patient was noted to have fever 99.7, tachypneic at 25, chest x-ray significant for left lung pneumonia, lactic acid elevated at 2, white blood cell count elevated at 11K, her UA was significant for nitrate and bacteriuria, but no pyuria, CT head with no acute findings, [facility] hospitalist consulted to admit for pneumonia.    Review of systems:      A full 10 point Review of Systems was done, except as stated above, all other Review of Systems were negative.   With Past History of the following :    Past Medical History:  Diagnosis Date   Adenomatous colon polyp    Arthritis    Bursitis    right hip   Complication of anesthesia    turned gray before not sure if from anesthesia   Diabetes mellitus    type 2   Diverticulosis    GERD (gastroesophageal reflux disease)    History of kidney stones    Hyperlipemia     Hypertension    Pneumonia    Sleep apnea    obstructive   Urinary incontinence       Past Surgical History:  Procedure Laterality Date   ABDOMINAL HYSTERECTOMY     CARPAL TUNNEL RELEASE     COLONOSCOPY  02/25/2012   Procedure: COLONOSCOPY;  Surgeon: Tami Falcon, MD;  Location: [facility];  Service: Endoscopy;  Laterality: N/A;   DILATION AND CURETTAGE OF UTERUS     EYE SURGERY     caract   JOINT REPLACEMENT     knee   bil   SHOULDER ARTHROSCOPY Left 09/08/2019   Procedure: Left Shoulder Arthroscopy, biceps tenotomy; acromioplasty debridement;  Surgeon: Dayne Even, MD;  Location: [facility];  Service: Orthopedics;  Laterality: Left;   TONSILLECTOMY        Social History:     Social History   Tobacco Use   Smoking status: Never   Smokeless tobacco: Never  Substance Use Topics  Alcohol  use: No        Family History :   Family history was reviewed, nonpertinent   Home Medications:   Prior to Admission medications   Medication Sig Start Date End Date Taking? Authorizing Provider  ACIDOPHILUS LACTOBACILLUS PO Take 1 tablet by mouth daily. (10mg ; 2 billion active cultures) Take one tablet by mouth daily.   Yes [provider]  albuterol  (VENTOLIN  HFA) 108 (90 Base) MCG/ACT inhaler Inhale 1-2 puffs into the lungs every 6 (six) hours as needed. 02/14/24  Yes [provider]  amLODipine (NORVASC) 5 MG tablet Take 5 mg by mouth daily.  09/11/13  Yes [provider]  aspirin 81 MG chewable tablet Chew 81 mg by mouth daily.   Yes [provider]  losartan (COZAAR) 100 MG tablet Take 100 mg by mouth daily.  09/11/13  Yes [provider]  Magnesium Oxide 250 MG TABS Take 1 tablet by mouth at bedtime. Take one tablet by mouth every evening at bedtime.   Yes [provider]  metFORMIN (GLUCOPHAGE-XR) 500 MG 24 hr tablet Take 1,000 mg by mouth 2 (two) times daily. 02/14/24  Yes [provider]  nystatin cream (MYCOSTATIN)  Apply 1 Application topically 2 (two) times daily.   Yes [provider]  Omega-3 Fatty Acids (FISH OIL HIGH POTENCY PO) Take 2,000 mg by mouth daily. Fish Oil 2000mg  (600mg  Omega-3).  Takel one capsule by mouth daily.   Yes [provider]  omeprazole (PRILOSEC) 20 MG capsule Take 20 mg by mouth at bedtime. 01/09/24  Yes [provider]  pravastatin (PRAVACHOL) 80 MG tablet Take 80 mg by mouth daily. 02/08/24  Yes [provider]  trimethoprim (TRIMPEX) 100 MG tablet Take 100 mg by mouth daily. Take one tablet by mouth once daily for UTI prevention.   Yes [provider]     Allergies:     Allergies  Allergen Reactions   Prednisone Anaphylaxis and Other (See Comments)    Psychosis;      Physical Exam:   Vitals  Blood pressure (!) 163/129, pulse 78, temperature 98.2 F (36.8 C), temperature source Oral, resp. rate 18, SpO2 99%.   1. General Elderly female, laying in bed, in no apparent distress  2.  She is currently awake, oriented x 3, but remains mildly confused, not at baseline as discussed with daughter at bedside   3. No F.N deficits, ALL C.Nerves Intact, Strength 5/5 all 4 extremities, Sensation intact all 4 extremities, Plantars down going.  4. Ears and Eyes appear Normal, Conjunctivae clear, PERRLA. Moist Oral Mucosa.  5. Supple Neck, No JVD, No cervical lymphadenopathy appriciated, No Carotid Bruits.  6. Symmetrical Chest wall movement, Good air movement bilaterally  7. RRR, No Gallops, Rubs or Murmurs, No Parasternal Heave.  8. Positive Bowel Sounds, Abdomen Soft, No tenderness, No organomegaly appriciated,No rebound -guarding or rigidity.  9.  No Cyanosis, Normal Skin Turgor, No Skin Rash or Bruise.  10. Good muscle tone,  joints appear normal , no effusions, Normal ROM.     Data Review:    CBC Recent Labs  Lab 05/07/24 0238  WBC 11.0*  HGB 14.3  HCT 43.2  PLT 223  MCV 92.5  MCH 30.6  MCHC 33.1  RDW 13.3   LYMPHSABS 2.5  MONOABS 0.6  EOSABS 0.2  BASOSABS 0.1   ------------------------------------------------------------------------------------------------------------------  Chemistries  Recent Labs  Lab 05/07/24 0238  NA 139  K 4.0  CL 105  CO2 25  GLUCOSE 176*  BUN 8  CREATININE 0.68  CALCIUM 9.4  AST 32  ALT 25  ALKPHOS 72  BILITOT 0.9   ------------------------------------------------------------------------------------------------------------------ CrCl cannot be calculated (Unknown ideal weight.). ------------------------------------------------------------------------------------------------------------------ No results for input(s): "TSH", "T4TOTAL", "T3FREE", "THYROIDAB" in the last 72 hours.  Invalid input(s): "FREET3"  Coagulation profile Recent Labs  Lab 05/07/24 0238  INR 1.1   ------------------------------------------------------------------------------------------------------------------- No results for input(s): "DDIMER" in the last 72 hours. -------------------------------------------------------------------------------------------------------------------  Cardiac Enzymes No results for input(s): "CKMB", "TROPONINI", "MYOGLOBIN" in the last 168 hours.  Invalid input(s): "CK" ------------------------------------------------------------------------------------------------------------------ No results found for: "BNP"   ---------------------------------------------------------------------------------------------------------------  Urinalysis    Component Value Date/Time   COLORURINE YELLOW 05/07/2024 0304   APPEARANCEUR CLEAR 05/07/2024 0304   LABSPEC 1.011 05/07/2024 0304   PHURINE 7.0 05/07/2024 0304   GLUCOSEU NEGATIVE 05/07/2024 0304   HGBUR NEGATIVE 05/07/2024 0304   BILIRUBINUR NEGATIVE 05/07/2024 0304   KETONESUR NEGATIVE 05/07/2024 0304   PROTEINUR 30 (A) 05/07/2024 0304   UROBILINOGEN 0.2 09/18/2010 1120   NITRITE POSITIVE (A)  05/07/2024 0304   LEUKOCYTESUR NEGATIVE 05/07/2024 0304    ----------------------------------------------------------------------------------------------------------------   Imaging Results:    CT Head Wo Contrast Result Date: 05/07/2024 CLINICAL DATA:  Headache and confusion. EXAM: CT HEAD WITHOUT CONTRAST TECHNIQUE: Contiguous axial images were obtained from the base of the skull through the vertex without intravenous contrast. RADIATION DOSE REDUCTION: This exam was performed according to the departmental dose-optimization program which includes automated exposure control, adjustment of the mA and/or kV according to patient size and/or use of iterative reconstruction technique. COMPARISON:  March 05, 2024 FINDINGS: Brain: There is generalized cerebral atrophy with widening of the extra-axial spaces and ventricular dilatation. There are areas of decreased attenuation within the white matter tracts of the supratentorial brain, consistent with microvascular disease changes. Vascular: Marked severity calcification of the bilateral cavernous carotid arteries is noted. Skull: Normal. Negative for fracture or focal lesion. Sinuses/Orbits: No acute finding. Other: None. IMPRESSION: 1. Generalized cerebral atrophy and microvascular disease changes of the supratentorial brain. 2. No acute intracranial abnormality. Electronically Signed   By: Virgle Grime M.D.   On: 05/07/2024 04:16   DG Chest Port 1 View Result Date: 05/07/2024 CLINICAL DATA:  Questionable sepsis, altered mental status EXAM: PORTABLE CHEST 1 VIEW COMPARISON:  03/05/2024 FINDINGS: Stable cardiomegaly. Pulmonary vascular congestion. Airspace opacities in the left mid and lower lung suspicious for pneumonia. No pleural effusion or pneumothorax. IMPRESSION: Left mid and lower lung airspace opacities suspicious for pneumonia. Electronically Signed   By: Rozell Cornet M.D.   On: 05/07/2024 03:47     EKG:  Vent. rate 75 BPM PR interval 201  ms QRS duration 86 ms QT/QTcB 394/441 ms P-R-T axes 68 60 45 Sinus rhythm Anteroseptal infarct, old   Assessment & Plan:    Principal Problem:   CAP (community acquired pneumonia) Active Problems:   Hyperlipidemia   HYPERTENSION   G E R D   Acute metabolic encephalopathy - Is most likely in the setting of her acute infectious process due to pneumonia - CT head with no acute findings - Will check TSH, B12 .  Sepsis, POA due to pneumonia - Sepsis POA, tachypneic at 25, low-grade temp 99.7, white blood cell count of 11K and elevated lactic acid-this x-ray significant for left lung pneumonia - Recent admission at [facility] last March for influenza pneumonia/bronchitis - Mated under pneumonia pathway, check strep pneumonia, Legionella antigen, and sputum culture - Follow-up blood cultures -encouraged use incentive spirometry  and flutter valve  Hyperlipidemia - Continue with home dose statin and fish oil  Hypertension - Blood pressure elevated, continue with home regimen and will add as needed IV hydralazine  GERD - Continue with PPI  Weakness/deconditioning - Will consult PT/OT  Diabetes mellitus - Will hold metformin and keep on insulin sliding scale during hospital stay  Obstructive  sleep apnea - He has not been compliant with CPAP at home, but per daughter she had her machine fixed recently, so we will start on CPAP during hospital stay to see if she can tolerate  DVT Prophylaxis Lovenox   AM Labs Ordered, also please review Full Orders  Family Communication: Admission, patients condition and plan of care including tests being ordered have been discussed with the patient and daughter at bedside who indicate understanding and agree with the plan and Code Status.  Code Status full code  Likely DC to likely home with home health  Consults called: None  Admission status: Inpatient  Time spent in minutes : 70 minutes   Seena Dadds M.D on  05/07/2024 at 7:26 AM   [facility] - Office  [phone]

## 2024-05-07 NOTE — Evaluation (Signed)
 Occupational Therapy Evaluation Patient Details Name: Margaret Rocha MRN: [id number] DOB: January 16, 1946 Today's Date: 05/07/2024   History of Present Illness   78 y.o. female, admitted 5/29 with Acute respiratory failure with hypoxia, likely due to PNA. PMH: diabetes mellitus, hypertension, hyperlipidemia, obstructive sleep apnea, noncompliant with CPAP, recent hospitalization at [facility] March 2025, secondary to influenza pneumonia/colitis, she was discharged home with home oxygen, but weaned off oxygen after month.     Clinical Impressions Pt typically walks with a cane and endorses falls in which she was unable to get up. She stands to shower holding soap dish as she steps over the edge of the tub. She reports difficulty donning socks and her brief, attributes to briefs being too small. Pt relies on mobile meals and can prepare a simple meal on her own. She wants to be able to walk out to her car. Pt presents with impaired cognition of which she is aware she is not at her baseline, generalized weakness and impaired standing balance. She is amenable to use of a RW in place of her cane. Pt will benefit from [facility] to address ADLs, instruct in fall prevention during ADLs and IADLs, tub equipment and compensatory strategies for LB ADLs.      If plan is discharge home, recommend the following:   A little help with walking and/or transfers;A lot of help with bathing/dressing/bathroom;Assistance with cooking/housework;Assist for transportation;Help with stairs or ramp for entrance;Direct supervision/assist for financial management;Direct supervision/assist for medications management     Functional Status Assessment   Patient has had a recent decline in their functional status and demonstrates the ability to make significant improvements in function in a reasonable and predictable amount of time.     Equipment Recommendations    (defer tub equipment to [facility])     Recommendations for  Other Services         Precautions/Restrictions   Precautions Precautions: Fall Recall of Precautions/Restrictions: Intact Restrictions Weight Bearing Restrictions Per Provider Order: No     Mobility Bed Mobility               General bed mobility comments: in recliner    Transfers Overall transfer level: Needs assistance Equipment used: Straight cane Transfers: Sit to/from Stand Sit to Stand: Contact guard assist           General transfer comment: increased time with cane, pt reaching to stabilize with her opposite hand with ambulation      Balance Overall balance assessment: Needs assistance   Sitting balance-Leahy Scale: Good     Standing balance support: Single extremity supported Standing balance-Leahy Scale: Poor Standing balance comment: CGA at sink for grooming                           ADL either performed or assessed with clinical judgement   ADL Overall ADL's : Needs assistance/impaired Eating/Feeding: Independent;Sitting   Grooming: Wash/dry hands;Standing;Contact guard assist   Upper Body Bathing: Supervision/ safety;Standing   Lower Body Bathing: Supervison/ safety   Upper Body Dressing : Sitting;Minimal assistance   Lower Body Dressing: Moderate assistance;Sit to/from stand   Toilet Transfer: Contact guard assist;Ambulation;Grab bars (cane)   Toileting- Clothing Manipulation and Hygiene: Minimal assistance;Sit to/from stand Toileting - Clothing Manipulation Details (indicate cue type and reason): pt managed pericare, assist to pull up brief in back     Functional mobility during ADLs: Contact guard assist;Rolling walker (2 wheels);Cane General ADL Comments: Recommended daughters supervise  showering. Pt could benefit from shower equipment, will defer to HHOT.     Vision Baseline Vision/History: 1 Wears glasses Ability to See in Adequate Light: 0 Adequate Patient Visual Report: No change from baseline        Perception         Praxis         Pertinent Vitals/Pain Pain Assessment Pain Assessment: No/denies pain     Extremity/Trunk Assessment Upper Extremity Assessment Upper Extremity Assessment: Generalized weakness   Lower Extremity Assessment Lower Extremity Assessment: Generalized weakness   Cervical / Trunk Assessment Cervical / Trunk Assessment: Normal (increased body habitus)   Communication Communication Communication: No apparent difficulties   Cognition Arousal: Alert Behavior During Therapy: WFL for tasks assessed/performed Cognition: Cognition impaired     Awareness: Intellectual awareness intact, Online awareness impaired     Executive functioning impairment (select all impairments): Problem solving OT - Cognition Comments: Pt stating, "My mind is just not right yet."                 Following commands: Intact       Cueing  General Comments   Cueing Techniques: Verbal cues  SpO2 98% on 2L at rest and ambulating, minimal dyspnea. Extensive education to pt and daugher, all questions answered.   Exercises     Shoulder Instructions      Home Living Family/patient expects to be discharged to:: Private residence Living Arrangements: Children Available Help at Discharge: Family;Available 24 hours/day (daughters will be alternating to provide 24 hour supervision initially) Type of Home: Mobile home Home Access: Stairs to enter Entrance Stairs-Number of Steps: 6 Entrance Stairs-Rails: Right;Left Home Layout: One level     Bathroom Shower/Tub: Chief Strategy Officer: Standard     Home Equipment: Rollator (4 wheels);Cane - single point          Prior Functioning/Environment Prior Level of Function : Independent/Modified Independent;History of Falls (last six months)             Mobility Comments: Uses SPC, reports one fall in past 6 months. ADLs Comments: ind, receives meals on wheels but can cook "some", stands to  shower, reports difficulty donning brief due to incorrect size and socks    OT Problem List: Decreased strength;Impaired balance (sitting and/or standing);Decreased cognition;Decreased safety awareness;Decreased knowledge of use of DME or AE;Obesity   OT Treatment/Interventions:        OT Goals(Current goals can be found in the care plan section)   Acute Rehab OT Goals OT Goal Formulation: With patient Time For Goal Achievement: 05/21/24 Potential to Achieve Goals: Good ADL Goals Pt Will Perform Grooming: with modified independence;standing Pt Will Perform Lower Body Bathing: with modified independence;sit to/from stand Pt Will Perform Lower Body Dressing: with modified independence;sit to/from stand Pt Will Transfer to Toilet: with modified independence;ambulating;regular height toilet Pt Will Perform Toileting - Clothing Manipulation and hygiene: with modified independence;sit to/from stand Pt Will Perform Tub/Shower Transfer: Tub transfer;with supervision;ambulating;rolling walker (HHOT to determine optimal tub equipment with pt's input) Additional ADL Goal #1: Pt will state at least 3 fall prevention strategies as instructed.   OT Frequency:       Co-evaluation   Reason for Co-Treatment: Necessary to address cognition/behavior during functional activity;For patient/therapist safety;To address functional/ADL transfers PT goals addressed during session: Mobility/safety with mobility;Balance;Proper use of DME        AM-PAC OT "6 Clicks" Daily Activity     Outcome Measure Help from another person eating meals?:  None Help from another person taking care of personal grooming?: A Little Help from another person toileting, which includes using toliet, bedpan, or urinal?: A Little Help from another person bathing (including washing, rinsing, drying)?: A Little Help from another person to put on and taking off regular upper body clothing?: A Little Help from another person to put  on and taking off regular lower body clothing?: A Lot 6 Click Score: 18   End of Session Equipment Utilized During Treatment: Rolling walker (2 wheels);Oxygen (2L)  Activity Tolerance: Patient tolerated treatment well Patient left: in chair;with call bell/phone within reach;with nursing/sitter in room;with family/visitor present  OT Visit Diagnosis: Unsteadiness on feet (R26.81)                Time: 1111-1150 OT Time Calculation (min): 39 min Charges:  OT General Charges $OT Visit: 1 Visit OT Evaluation $OT Eval Moderate Complexity: 1 Mod OT Treatments $Self Care/Home Management : 8-22 mins Avanell Leigh, OTR/L Acute Rehabilitation Services Office: [phone]   Jonette Nestle 05/07/2024, 12:27 PM

## 2024-05-07 NOTE — Evaluation (Signed)
 Physical Therapy Evaluation Patient Details Name: Margaret Rocha MRN: [id number] DOB: October 18, 1946 Today's Date: 05/07/2024  History of Present Illness  78 y.o. female, admitted 5/29 with Acute respiratory failure with hypoxia, likely due to PNA. PMH: diabetes mellitus, hypertension, hyperlipidemia, obstructive sleep apnea, noncompliant with CPAP, recent hospitalization at [facility] March 2025, secondary to influenza pneumonia/colitis, she was discharged home with home oxygen, but weaned off oxygen after month.  Clinical Impression  Pt admitted with above diagnosis. Ind with one fall reported in the past 6 months. Uses SPC at baseline. Currently supervision for safety, ambulating up to 120 feet with rolling walker today, on 2L supplemental O2 at 98% SpO2 HR in 70s. Open to HHPT follow-up at d/c. Recommend RW for use during admission. All questions answered. Family educated on virtual call availability as needed. [employer] for HEP will be sent. Pt currently with functional limitations due to the deficits listed below (see PT Problem List). Pt will benefit from acute skilled PT to increase their independence and safety with mobility to allow discharge.           If plan is discharge home, recommend the following: A little help with walking and/or transfers;A little help with bathing/dressing/bathroom;Assistance with cooking/housework;Assist for transportation;Help with stairs or ramp for entrance   Can travel by private vehicle        Equipment Recommendations Rolling walker (2 wheels)  Recommendations for Other Services       Functional Status Assessment Patient has had a recent decline in their functional status and demonstrates the ability to make significant improvements in function in a reasonable and predictable amount of time.     Precautions / Restrictions Precautions Precautions: Fall Recall of Precautions/Restrictions: Intact Restrictions Weight Bearing Restrictions Per  Provider Order: No      Mobility  Bed Mobility               General bed mobility comments: in recliner    Transfers Overall transfer level: Needs assistance Equipment used: Straight cane Transfers: Sit to/from Stand Sit to Stand: Contact guard assist           General transfer comment: CGA for safety to stand from recliner and toilet, usese [facility] for support.    Ambulation/Gait Ambulation/Gait assistance: Contact guard assist, Supervision Gait Distance (Feet): 120 Feet (+15) Assistive device: Rolling walker (2 wheels), Straight cane Gait Pattern/deviations: Step-through pattern, Decreased stride length, Wide base of support, Trunk flexed, Antalgic Gait velocity: dec Gait velocity interpretation: <1.8 ft/sec, indicate of risk for recurrent falls   General Gait Details: CGA with SPC for short distance to bathroom, furniture surfing. Educated on safety and awareness. Replaced AD with RW and pt navigates with supervision, no overt LOB. SpO2 98% on 2L supplemenatl O2. Educated on proximity to device to maximize support and efficiency.  Stairs Stairs:  (Daughter present, feels comfortable assisting at pt at home.)          Wheelchair Mobility     Tilt Bed    Modified Rankin (Stroke Patients Only)       Balance Overall balance assessment: Needs assistance Sitting-balance support: No upper extremity supported, Feet supported Sitting balance-Leahy Scale: Good     Standing balance support: Single extremity supported Standing balance-Leahy Scale: Poor Standing balance comment: SPC for safety                             Pertinent Vitals/Pain Pain Assessment Pain Assessment: No/denies pain  Home Living Family/patient expects to be discharged to:: Private residence Living Arrangements: Children Available Help at Discharge: Family;Available 24 hours/day (Daughter's can alternate schdule to supervise for a while) Type of Home: Mobile home Home  Access: Stairs to enter Entrance Stairs-Rails: Right;Left Entrance Stairs-Number of Steps: 6   Home Layout: One level Home Equipment: Rollator (4 wheels);Cane - single point      Prior Function Prior Level of Function : Independent/Modified Independent;History of Falls (last six months)             Mobility Comments: Uses SPC, reports one fall in past 6 months. ADLs Comments: ind, receives meals on wheels but can cook "some"     Extremity/Trunk Assessment   Upper Extremity Assessment Upper Extremity Assessment: Defer to OT evaluation    Lower Extremity Assessment Lower Extremity Assessment: Generalized weakness    Cervical / Trunk Assessment Cervical / Trunk Assessment: Normal  Communication   Communication Communication: No apparent difficulties    Cognition Arousal: Alert Behavior During Therapy: WFL for tasks assessed/performed   PT - Cognitive impairments: Problem solving                       PT - Cognition Comments: Oriented x4. Feels a little "off" slightly tangential. Following commands: Intact       Cueing Cueing Techniques: Verbal cues     General Comments General comments (skin integrity, edema, etc.): SpO2 98% on 2L at rest and ambulating, minimal dyspnea. Extensive education to pt and daugher, all questions answered.    Exercises Other Exercises Other Exercises: Via [employer], sent to Margaret Rocha (daughter.)   Assessment/Plan    PT Assessment Patient needs continued PT services  PT Problem List Decreased strength;Decreased activity tolerance;Decreased balance;Decreased mobility;Decreased knowledge of use of DME;Decreased cognition;Cardiopulmonary status limiting activity       PT Treatment Interventions DME instruction;Gait training;Stair training;Functional mobility training;Therapeutic activities;Therapeutic exercise;Balance training;Neuromuscular re-education;Patient/family education;Cognitive remediation    PT Goals (Current goals  can be found in the Care Plan section)  Acute Rehab PT Goals Patient Stated Goal: Go home get well PT Goal Formulation: With patient/family Time For Goal Achievement: 05/21/24 Potential to Achieve Goals: Good    Frequency Min 1X/week     Co-evaluation PT/OT/SLP Co-Evaluation/Treatment: Yes Reason for Co-Treatment: Necessary to address cognition/behavior during functional activity;For patient/therapist safety;To address functional/ADL transfers PT goals addressed during session: Mobility/safety with mobility;Balance;Proper use of DME         AM-PAC PT "6 Clicks" Mobility  Outcome Measure Help needed turning from your back to your side while in a flat bed without using bedrails?: None Help needed moving from lying on your back to sitting on the side of a flat bed without using bedrails?: None Help needed moving to and from a bed to a chair (including a wheelchair)?: A Little Help needed standing up from a chair using your arms (e.g., wheelchair or bedside chair)?: A Little Help needed to walk in hospital room?: A Little Help needed climbing 3-5 steps with a railing? : A Little 6 Click Score: 20    End of Session Equipment Utilized During Treatment: Oxygen Activity Tolerance: Patient tolerated treatment well Patient left: in chair;with call bell/phone within reach;with family/visitor present Nurse Communication: Mobility status PT Visit Diagnosis: Unsteadiness on feet (R26.81);Other abnormalities of gait and mobility (R26.89);History of falling (Z91.81);Muscle weakness (generalized) (M62.81)    Time: 1610-9604 PT Time Calculation (min) (ACUTE ONLY): 36 min   Charges:   PT Evaluation $PT Eval Low  Complexity: 1 Low   PT General Charges $$ ACUTE PT VISIT: 1 Visit         Jory Ng, PT, DPT [facility]  Rehabilitation Services Physical Therapist Office: [phone] Website: [email or web address]   Alinda Irani 05/07/2024, 12:02 PM

## 2024-05-07 NOTE — ED Triage Notes (Signed)
 Pt bibrcems from home pt c/o headache and confusion that began at 1 am this morning. Family states pt has hx of UTI. Pt confused with EMS 142/94 Hr 78 96% 2L  98.5

## 2024-05-07 NOTE — Progress Notes (Signed)
 Patient made RN aware that "I am feeling stuffy". RN refused the temperature in the room per request. Patient room temperature was at the warmest setting, RN notified MD for a nasal spray per patient request.

## 2024-05-07 NOTE — Progress Notes (Signed)
 SATURATION QUALIFICATIONS: (This note is used to comply with regulatory documentation for home oxygen)  Patient Saturations on Room Air at Rest = 95%  Patient Saturations on Room Air while Ambulating = 98%  Patient Saturations on 0 Liters of oxygen while Ambulating = 98%  Please briefly explain why patient needs home oxygen:  Patient ambulated with RN at bedside on room air. Patient oxygen saturation maintain 95% and above during a walk around the room and into the bathroom. Patient showed no signs of distress. RN assisted patient to the bathroom. Educated patient on pulling on the string with ready. Daughter at bedside and educated daughter.

## 2024-05-07 NOTE — ED Provider Notes (Signed)
 [facility] Provider Note   CSN: [id number] Arrival date & time: 05/07/24  4098     History  Chief Complaint  Patient presents with   Altered Mental Status   Level 5 caveat due to altered mental status Margaret Rocha is a 78 y.o. female.  The history is provided by the patient and a relative.  Patient w/history of hypertension, diabetes presents for altered mental status Patient and daughter reported the past several hours she is felt confused and having headaches.  She also reports chills.  Patient has a history of frequent UTIs and is on daily trimethoprim.  No recent falls or trauma.  No recent change in her medications.  Daughter reports she was admitted to an outside hospital several months ago for influenza and pneumonia     Past Medical History:  Diagnosis Date   Adenomatous colon polyp    Arthritis    Bursitis    right hip   Complication of anesthesia    turned gray before not sure if from anesthesia   Diabetes mellitus    type 2   Diverticulosis    GERD (gastroesophageal reflux disease)    History of kidney stones    Hyperlipemia    Hypertension    Pneumonia    Sleep apnea    obstructive   Urinary incontinence     Home Medications Prior to Admission medications   Medication Sig Start Date End Date Taking? Authorizing Provider  albuterol  (VENTOLIN  HFA) 108 (90 Base) MCG/ACT inhaler Inhale 1-2 puffs into the lungs every 6 (six) hours as needed. 02/14/24  Yes [provider]  MAGNESIUM -OXIDE 400 (240 Mg) MG tablet Take 1 tablet by mouth 2 (two) times daily. 12/09/23  Yes [provider]  metFORMIN (GLUCOPHAGE-XR) 500 MG 24 hr tablet Take 1,000 mg by mouth 2 (two) times daily. 02/14/24  Yes [provider]  omeprazole (PRILOSEC) 20 MG capsule Take 20 mg by mouth at bedtime. 01/09/24  Yes [provider]  pravastatin  (PRAVACHOL ) 80 MG tablet Take 80 mg by mouth daily. 02/08/24  Yes  [provider]  acidophilus (RISAQUAD) CAPS capsule Take 1 capsule by mouth daily.    [provider]  amLODipine  (NORVASC ) 5 MG tablet Take 5 mg by mouth daily.  09/11/13   [provider]  APPLE CIDER VINEGAR PO Take 1 capsule by mouth daily.    [provider]  aspirin  EC 81 MG tablet Take 81 mg by mouth daily.    [provider]  ciclopirox  (PENLAC ) 8 % solution Apply topically at bedtime. Apply over nail and surrounding skin. Apply daily over previous coat. After seven (7) days, may remove with alcohol  and continue cycle. 05/12/20   Stover, Titorya, DPM  FOLIC ACID PO Take 1 tablet by mouth daily.    [provider]  hydrochlorothiazide (HYDRODIURIL) 25 MG tablet Take 25 mg by mouth daily.    [provider]  HYDROcodone -acetaminophen  (NORCO) 5-325 MG tablet Take 1-2 tablets by mouth every 6 (six) hours as needed for moderate pain. 09/08/19   Lucrezia Sachs, PA-C  loratadine (CLARITIN) 10 MG tablet Take 10 mg by mouth daily.    [provider]  losartan  (COZAAR ) 100 MG tablet Take 100 mg by mouth daily.  09/11/13   [provider]  MAGNESIUM -OXIDE 400 (241.3 MG) MG tablet Take 400 mg by mouth 2 (two) times daily.  01/27/15   [provider]  metFORMIN (GLUCOPHAGE) 500  MG tablet Take 500-1,000 mg by mouth See admin instructions. Take 500 mg by mouth in the morning and 1000 mg at night    [provider]  Omega-3 Fatty Acids (FISH OIL PO) Take 1 capsule by mouth daily.     [provider]  omeprazole (PRILOSEC) 40 MG capsule Take 40 mg by mouth daily.  01/27/15   [provider]  oxybutynin (DITROPAN) 5 MG tablet Take 5 mg by mouth 2 (two) times daily.  01/27/15   [provider]  potassium chloride  SA (K-DUR) 20 MEQ tablet Take 20 mEq by mouth daily.    [provider]  pravastatin  (PRAVACHOL ) 40 MG tablet Take 40 mg by mouth daily.  07/17/15   [provider]   venlafaxine XR (EFFEXOR-XR) 75 MG 24 hr capsule Take 75 mg by mouth daily with breakfast.  09/11/13   [provider]  vitamin B-12 (CYANOCOBALAMIN ) 1000 MCG tablet Take 1,000 mcg by mouth daily.     [provider]      Allergies    Patient has no known allergies.    Review of Systems   Review of Systems  Unable to perform ROS: Mental status change    Physical Exam Updated Vital Signs BP (!) 150/119   Pulse 80   Temp 99.7 F (37.6 C) (Rectal)   Resp 17   SpO2 98%  Physical Exam CONSTITUTIONAL: Elderly, anxious, ill-appearing HEAD: Normocephalic/atraumatic, no visible trauma EYES: EOMI/PERRL ENMT: Mucous membranes dry NECK: supple no meningeal signs CV: S1/S2 noted, no murmurs/rubs/gallops noted LUNGS: Lungs are clear to auscultation bilaterally, no apparent distress ABDOMEN: soft, nontender obese GU: Wearing a diaper NEURO: Pt is awake and alert, tremulous, reports she is confused but answering most questions appropriately.  No facial droop, no arm or leg drift.   EXTREMITIES: pulses normal/equal, full ROM No deformities SKIN: warm, color normal, no wounds noted to the sacrum or buttocks, nurse present PSYCH: Anxious ED Results / Procedures / Treatments   Labs (all labs ordered are listed, but only abnormal results are displayed) Labs Reviewed  URINALYSIS, W/ REFLEX TO CULTURE (INFECTION SUSPECTED) - Abnormal; Notable for the following components:      Result Value   Protein, ur 30 (*)    Nitrite POSITIVE (*)    Bacteria, UA FEW (*)    All other components within normal limits  COMPREHENSIVE METABOLIC PANEL WITH GFR - Abnormal; Notable for the following components:   Glucose, Bld 176 (*)    Total Protein 6.4 (*)    All other components within normal limits  CBC WITH DIFFERENTIAL/PLATELET - Abnormal; Notable for the following components:   WBC 11.0 (*)    All other components within normal limits  I-STAT CG4 LACTIC ACID, ED - Abnormal; Notable  for the following components:   Lactic Acid, Venous 2.1 (*)    All other components within normal limits  CULTURE, BLOOD (ROUTINE X 2)  CULTURE, BLOOD (ROUTINE X 2)  PROTIME-INR  HEMOGLOBIN A1C  I-STAT CG4 LACTIC ACID, ED    EKG EKG Interpretation Date/Time:  Thursday May 07 2024 04:53:57 EDT Ventricular Rate:  75 PR Interval:  201 QRS Duration:  86 QT Interval:  394 QTC Calculation: 441 R Axis:   60  Text Interpretation: Sinus rhythm Anteroseptal infarct, old Confirmed by Eldon Greenland ([id number]) on 05/07/2024 5:17:34 AM  Radiology CT Head Wo Contrast Result Date: 05/07/2024 CLINICAL DATA:  Headache and confusion. EXAM: CT HEAD WITHOUT CONTRAST TECHNIQUE: Contiguous axial images were  obtained from the base of the skull through the vertex without intravenous contrast. RADIATION DOSE REDUCTION: This exam was performed according to the departmental dose-optimization program which includes automated exposure control, adjustment of the mA and/or kV according to patient size and/or use of iterative reconstruction technique. COMPARISON:  March 05, 2024 FINDINGS: Brain: There is generalized cerebral atrophy with widening of the extra-axial spaces and ventricular dilatation. There are areas of decreased attenuation within the white matter tracts of the supratentorial brain, consistent with microvascular disease changes. Vascular: Marked severity calcification of the bilateral cavernous carotid arteries is noted. Skull: Normal. Negative for fracture or focal lesion. Sinuses/Orbits: No acute finding. Other: None. IMPRESSION: 1. Generalized cerebral atrophy and microvascular disease changes of the supratentorial brain. 2. No acute intracranial abnormality. Electronically Signed   By: Virgle Grime M.D.   On: 05/07/2024 04:16   DG Chest Port 1 View Result Date: 05/07/2024 CLINICAL DATA:  Questionable sepsis, altered mental status EXAM: PORTABLE CHEST 1 VIEW COMPARISON:  03/05/2024 FINDINGS: Stable  cardiomegaly. Pulmonary vascular congestion. Airspace opacities in the left mid and lower lung suspicious for pneumonia. No pleural effusion or pneumothorax. IMPRESSION: Left mid and lower lung airspace opacities suspicious for pneumonia. Electronically Signed   By: Rozell Cornet M.D.   On: 05/07/2024 03:47    Procedures Procedures    Medications Ordered in ED Medications  sodium chloride  0.9 % bolus 1,000 mL (1,000 mLs Intravenous New Bag/Given 05/07/24 0451)  cefTRIAXone  (ROCEPHIN ) 1 g in sodium chloride  0.9 % 100 mL IVPB (1 g Intravenous New Bag/Given 05/07/24 0451)  azithromycin (ZITHROMAX) 500 mg in sodium chloride  0.9 % 250 mL IVPB (has no administration in time range)  enoxaparin (LOVENOX) injection 40 mg (has no administration in time range)  insulin aspart (novoLOG) injection 0-9 Units (has no administration in time range)  insulin aspart (novoLOG) injection 0-5 Units (has no administration in time range)  acetaminophen  (TYLENOL ) tablet 650 mg (has no administration in time range)  prochlorperazine (COMPAZINE) injection 5 mg (has no administration in time range)  polyethylene glycol (MIRALAX / GLYCOLAX) packet 17 g (has no administration in time range)  melatonin tablet 5 mg (has no administration in time range)  ipratropium-albuterol  (DUONEB) 0.5-2.5 (3) MG/3ML nebulizer solution 3 mL (has no administration in time range)  guaiFENesin-dextromethorphan (ROBITUSSIN DM) 100-10 MG/5ML syrup 5 mL (has no administration in time range)    ED Course/ Medical Decision Making/ A&P Clinical Course as of 05/07/24 0517  Thu May 07, 2024  0325 Patient seen after arrival for altered mental status and chills and headache.  Patient is awake and alert and is very anxious.  Daughter reports she has been confused and her tremors are increasing Rectal temp 99. Urinalysis, labs and imaging have been ordered [DW]  0439 Patient reports feeling improved.  Imaging suggest new onset pneumonia Given lab  findings of leukocytosis, mildly elevated lactate, pneumonia is more likely.  Family reports she had similar symptoms with previous admissions to an outside hospital  IV antibiotics have been ordered and she will be admitted [DW]  [id number] Discussed with [facility] for admission  CT head was negative, no signs of focal neurologic deficits at this time Suspect her confusion and tremulousness is due to the underlying infection [DW]    Clinical Course User Index [DW] Eldon Greenland, MD  Medical Decision Making Amount and/or Complexity of Data Reviewed Labs: ordered. Radiology: ordered.  Risk Decision regarding hospitalization.   This patient presents to the ED for concern of altered mental status, this involves an extensive number of treatment options, and is a complaint that carries with it a high risk of complications and morbidity.  The differential diagnosis includes but is not limited to CVA, intracranial hemorrhage, acute coronary syndrome, renal failure, urinary tract infection, electrolyte disturbance, pneumonia   Comorbidities that complicate the patient evaluation: Patient's presentation is complicated by their history of hypertension  Additional history obtained: Additional history obtained from family   Lab Tests: I Ordered, and personally interpreted labs.  The pertinent results include: Leukocytosis  Imaging Studies ordered: I ordered imaging studies including CT scan head and X-ray chest  I independently visualized and interpreted imaging which showed CT head negative, x-ray consistent with pneumonia I agree with the radiologist interpretation  Cardiac Monitoring: The patient was maintained on a cardiac monitor.  I personally viewed and interpreted the cardiac monitor which showed an underlying rhythm of:  sinus rhythm  Medicines ordered and prescription drug management: I ordered medication including IV antibiotics for  pneumonia Reevaluation of the patient after these medicines showed that the patient    stayed the same  Critical Interventions:   IV antibiotics and admission  Consultations Obtained: I requested consultation with the admitting physician [facility], and discussed  findings as well as pertinent plan - they recommend: admit  Reevaluation: After the interventions noted above, I reevaluated the patient and found that they have :improved  Complexity of problems addressed: Patient's presentation is most consistent with  acute presentation with potential threat to life or bodily function  Disposition: After consideration of the diagnostic results and the patient's response to treatment,  I feel that the patent would benefit from admission  .           Final Clinical Impression(s) / ED Diagnoses Final diagnoses:  Community acquired pneumonia of left lower lobe of lung  Confusion    Rx / DC Orders ED Discharge Orders     None         Eldon Greenland, MD 05/07/24 [phone]

## 2024-05-07 NOTE — Progress Notes (Signed)
 Sputum Culture collection cup at bedside. Pt understand instruction on how to use.

## 2024-05-07 NOTE — ED Notes (Signed)
 EDP Wickline at bedside gave verbal order for rectal temp and In and out cath for urine analysis. Cath completed with NT Amber and Primary RN at bedside. Pts daughter at bedside aware of POC

## 2024-05-07 NOTE — TOC Initial Note (Signed)
 Transition of Care [facility]) - Initial/Assessment Note    Patient Details  Name: Margaret Rocha MRN: [id number] Date of Birth: Sep 01, 1946  [facility] CM/SW Contact:    Terre Ferri, RN Phone Number: 05/07/2024, 1:01 PM  Clinical Narrative:                 Spoke to daughter Catherin Closs and patient at bedside.   Patient and Sherri live together.   Discuss PT/OT recommendations for HHPT/OT and rolling walker. Both in agreement.   Bartholomew Light with [facility] accepted referral for HHPT/OT and called Raechel Bulla with [facility] for rolling walker   Expected Discharge Plan: Home w Home Health Services Barriers to Discharge: Continued Medical Work up   Patient Goals and CMS Choice Patient states their goals for this hospitalization and ongoing recovery are:: to return to home CMS Medicare.gov Compare Post Acute Care list provided to:: Patient Choice offered to / list presented to : Patient, Adult Children      Expected Discharge Plan and Services   Discharge Planning Services: CM Consult Post Acute Care Choice: Home Health, Durable Medical Equipment Living arrangements for the past 2 months: Single Family Home                 DME Arranged: Walker rolling DME Agency: [facility] Date DME Agency Contacted: 05/07/24 Time DME Agency Contacted: 1300 Representative spoke with at DME Agency: [facility] HH Arranged: PT, OT HH Agency: [facility] Date HH Agency Contacted: 05/07/24 Time HH Agency Contacted: 1301 Representative spoke with at [facility] Agency: Bartholomew Light  Prior Living Arrangements/Services Living arrangements for the past 2 months: Single Family Home Lives with:: Adult Children Patient language and need for interpreter reviewed:: Yes Do you feel safe going back to the place where you live?: Yes      Need for Family Participation in Patient Care: Yes (Comment) Care giver support system in place?: Yes (comment)   Criminal Activity/Legal Involvement Pertinent to  Current Situation/Hospitalization: No - Comment as needed  Activities of Daily Living      Permission Sought/Granted   Permission granted to share information with : Yes, Verbal Permission Granted  Share Information with NAME: daughter Catherin Closs  Permission granted to share info w AGENCY: [facility] , [facility]        Emotional Assessment Appearance:: Appears stated age Attitude/Demeanor/Rapport: Engaged Affect (typically observed): Appropriate Orientation: : Oriented to Self, Oriented to Place, Oriented to  Time, Oriented to Situation Alcohol  / Substance Use: Not Applicable Psych Involvement: No (comment)  Admission diagnosis:  Confusion [R41.0] CAP (community acquired pneumonia) [J18.9] Community acquired pneumonia of left lower lobe of lung [J18.9] Patient Active Problem List   Diagnosis Date Noted   CAP (community acquired pneumonia) 05/07/2024   RESTRICTIVE LUNG DISEASE 09/16/2008   Hyperlipidemia 08/19/2008   DEPRESSION 08/19/2008   CARPAL TUNNEL SYNDROME 08/19/2008   HYPERTENSION 08/19/2008   ASTHMA 08/19/2008   G E R D 08/19/2008   SPINAL STENOSIS 08/19/2008   SLEEP APNEA 08/19/2008   COUGH 08/19/2008   PCP:  Olan Bering, MD Pharmacy:   [facility] [phone] [place]ana Kindler, [place] - [address] AT [facility] [address] [place] [address] Phone: [phone] Fax: [phone]  [facility] [address] [place] - [address] [facility] [place] [address] Phone: [phone] Fax: [phone]     Social Drivers of Health (SDOH) Social History: SDOH Screenings   Tobacco Use: Low  Risk  (12/08/2020)   SDOH Interventions:     Readmission Risk Interventions     No data to display

## 2024-05-07 NOTE — Progress Notes (Addendum)
 Hospital at Home Transfer Note   Patient: Margaret Rocha HQI:[id number] DOB: 1946/07/26 DOA: 05/07/2024     0 DOS: the patient was seen and examined on 05/07/2024   Brief hospital course: Trishna Cwik  is a 78 y.o. female, with past medical history of diabetes mellitus, hypertension, hyperlipidemia, obstructive sleep apnea, noncompliant with CPAP, recent hospitalization at [facility] March 2025, secondary to influenza pneumonia/colitis, she was discharged home with home oxygen, but weaned off oxygen after month, she presents to ED secondary to complaints of confusion and altered mental status, daughter at bedside assists with the history, apparently yesterday patient became more confused, altered, with poor appetite and generalized weakness, otherwise patient denies any other specific complaints. - In ED patient was noted to have fever 99.7, tachypneic at 25, chest x-ray significant for left lung pneumonia, lactic acid elevated at 2, white blood cell count elevated at 11K, her UA was significant for nitrate and bacteriuria, but no pyuria, CT head with no acute findings, [facility] hospitalist consulted to admit for pneumonia.   Assessment and Plan: Principal Problem:   CAP (community acquired pneumonia) Active Problems: Sepsis Acute metabolic encephalopathy   Hyperlipidemia   HYPERTENSION   G E R D Acute respiratory failure with hypoxia DM2 OSA on CPAP Debility    Acute respiratory failure with hypoxia  this patient has acute respiratory failure with Hypoxia below as documented by the presence of following: O2 saturatio< 90% on RA   Likely due to:   Pneumonia,  Provide O2 therapy and titrate as needed   flutter valve ordered  Pt ambulated on RA will continue to assess once gets home    Acute metabolic encephalopathy - Is most likely in the setting of her acute infectious process due to pneumonia - CT head with no acute findings - Will check TSH, B12  add ons  pending Persistent but improving   Sepsis, POA due to pneumonia - Sepsis POA, tachypneic at 25, low-grade temp 99.7, white blood cell count of 11K and elevated lactic acid-this x-ray significant for left lung pneumonia - Recent admission at [facility] last March for influenza pneumonia/bronchitis - Admitted under pneumonia pathway, check strep pneumonia, Legionella antigen, and sputum culture -ordered pending - Follow-up blood cultures -encouraged use incentive spirometry and flutter valve Will be provided to patient while at home  Hyperlipidemia - Continue with home dose statin  Pravachol  40 mg po q day and fish oil   Hypertension -  continue with home regimen will increase Norvasc to 10 mg po q day and monitor  GERD - Continue with PPI   debility - Will consult PT/OT   Diabetes mellitus - Will hold metformin will start on low dose Glipizide 2.5 mg po q day while at home   Obstructive  sleep apnea - He has not been compliant with CPAP at home, but per daughter she had her machine fixed recently, so we will start on CPAP during hospital stay to see if she can tolerate   DVT Prophylaxis Lovenox    AM Labs Ordered, also please review Full Orders   Family  and Patient Consent : Spent 30 minutes with family and patient discussing hospital and at home.  We discussed risk and benefits of transfer to Hospital at home Program  All questions have been answered discussed at length with family and patient  See Consent form:         Consent has been signed and witnessed  Code Status DNR  Consults called: None   Admission status: Inpatient    Subjective: Met patient and family at bedside discussed at length home at hospital program.  Patient and family agrees to voluntary participate Patient at this point endorses still confusion but improving Patient family endorsed excitement and being able to be home  Physical Exam: Vitals:   05/07/24 0311 05/07/24 0637  05/07/24 0758 05/07/24 0819  BP:  (!) 163/129 (!) 168/73   Pulse: 80 78 75   Resp: 17 18 20    Temp:  98.2 F (36.8 C) 98.4 F (36.9 C)   TempSrc:  Oral Oral   SpO2: 98% 99% 97% 99%     Data Reviewed: Hemoglobin A1c 6.5 INR 1.1 white blood cell count 11 hemoglobin 14.3 lactic acid 2.1 UA having nitrite positive and few bacteria but otherwise no leukocytosis patient    Disposition: Status is: Inpatient Remains inpatient appropriate because: Patient will require multiple days of  oxygen and IV antibiotics as she is still mildly confused Will require multiple days of acute care and monitoring    Patient eventually would to be discharged to home from hospital at home   Time spent: 80 minutes  Author: Selene Dais, MD 05/07/2024 11:20 AM  _____________________________________________________ Unfortunately patient resides outside of [facility] and has physical therapy needs that unable to be filled at this time by hospital at home team She will remain in care of [facility] Hospitalist Nedim Oki 1:08 PM  For on call review [email or web address].

## 2024-05-08 DIAGNOSIS — R6520 Severe sepsis without septic shock: Secondary | ICD-10-CM | POA: Insufficient documentation

## 2024-05-08 DIAGNOSIS — A419 Sepsis, unspecified organism: Secondary | ICD-10-CM

## 2024-05-08 DIAGNOSIS — G9341 Metabolic encephalopathy: Secondary | ICD-10-CM

## 2024-05-08 DIAGNOSIS — J189 Pneumonia, unspecified organism: Secondary | ICD-10-CM | POA: Diagnosis not present

## 2024-05-08 DIAGNOSIS — E785 Hyperlipidemia, unspecified: Secondary | ICD-10-CM

## 2024-05-08 DIAGNOSIS — I10 Essential (primary) hypertension: Secondary | ICD-10-CM | POA: Diagnosis not present

## 2024-05-08 DIAGNOSIS — K219 Gastro-esophageal reflux disease without esophagitis: Secondary | ICD-10-CM | POA: Diagnosis not present

## 2024-05-08 LAB — BLOOD GAS, VENOUS
Acid-Base Excess: 2 mmol/L (ref 0.0–2.0)
Bicarbonate: 27.3 mmol/L (ref 20.0–28.0)
O2 Saturation: 83.2 %
Patient temperature: 36.3
pCO2, Ven: 43 mmHg — ABNORMAL LOW (ref 44–60)
pH, Ven: 7.41 (ref 7.25–7.43)
pO2, Ven: 48 mmHg — ABNORMAL HIGH (ref 32–45)

## 2024-05-08 LAB — BLOOD CULTURE ID PANEL (REFLEXED) - BCID2

## 2024-05-08 LAB — CBC
HCT: 41.1 % (ref 36.0–46.0)
Hemoglobin: 13.5 g/dL (ref 12.0–15.0)
MCH: 30.3 pg (ref 26.0–34.0)
MCHC: 32.8 g/dL (ref 30.0–36.0)
MCV: 92.2 fL (ref 80.0–100.0)
Platelets: 201 10*3/uL (ref 150–400)
RBC: 4.46 MIL/uL (ref 3.87–5.11)
RDW: 13.2 % (ref 11.5–15.5)
WBC: 9.9 10*3/uL (ref 4.0–10.5)
nRBC: 0 % (ref 0.0–0.2)

## 2024-05-08 LAB — RENAL FUNCTION PANEL
Albumin: 3.3 g/dL — ABNORMAL LOW (ref 3.5–5.0)
Anion gap: 12 (ref 5–15)
BUN: 9 mg/dL (ref 8–23)
CO2: 21 mmol/L — ABNORMAL LOW (ref 22–32)
Calcium: 8.9 mg/dL (ref 8.9–10.3)
Chloride: 106 mmol/L (ref 98–111)
Creatinine, Ser: 0.55 mg/dL (ref 0.44–1.00)
GFR, Estimated: >60 mL/min (ref >60)
Glucose, Bld: 172 mg/dL — ABNORMAL HIGH (ref 70–99)
Phosphorus: 2.3 mg/dL — ABNORMAL LOW (ref 2.5–4.6)
Potassium: 3.1 mmol/L — ABNORMAL LOW (ref 3.5–5.1)
Sodium: 139 mmol/L (ref 135–145)

## 2024-05-08 LAB — GLUCOSE, CAPILLARY
Glucose-Capillary: 209 mg/dL — ABNORMAL HIGH (ref 70–99)
Glucose-Capillary: 196 mg/dL — ABNORMAL HIGH (ref 70–99)
Glucose-Capillary: 197 mg/dL — ABNORMAL HIGH (ref 70–99)
Glucose-Capillary: 244 mg/dL — ABNORMAL HIGH (ref 70–99)

## 2024-05-08 LAB — LACTIC ACID, PLASMA: Lactic Acid, Venous: 2.3 mmol/L (ref 0.5–1.9)

## 2024-05-08 LAB — MAGNESIUM: Magnesium: 1.6 mg/dL — ABNORMAL LOW (ref 1.7–2.4)

## 2024-05-08 MED ORDER — MAGNESIUM SULFATE 2 GM/50ML IV SOLN
2.0000 g | Freq: Once | INTRAVENOUS | Status: AC
  Administered 2024-05-08: 2 g via INTRAVENOUS
  Filled 2024-05-08: qty 50

## 2024-05-08 MED ORDER — INSULIN ASPART 100 UNIT/ML IJ SOLN
0.0000 [IU] | Freq: Three times a day (TID) | INTRAMUSCULAR | Status: DC
  Administered 2024-05-08 (×2): 3 [IU] via SUBCUTANEOUS
  Administered 2024-05-09: 5 [IU] via SUBCUTANEOUS

## 2024-05-08 MED ORDER — POTASSIUM CHLORIDE CRYS ER 20 MEQ PO TBCR
40.0000 meq | EXTENDED_RELEASE_TABLET | ORAL | Status: AC
  Administered 2024-05-08 (×2): 40 meq via ORAL
  Filled 2024-05-08 (×2): qty 2

## 2024-05-08 MED ORDER — INSULIN ASPART 100 UNIT/ML IJ SOLN
0.0000 [IU] | Freq: Every day | INTRAMUSCULAR | Status: DC
  Administered 2024-05-08: 2 [IU] via SUBCUTANEOUS

## 2024-05-08 MED ORDER — HYDRALAZINE HCL 25 MG PO TABS: 25.0000 mg | ORAL_TABLET | Freq: Four times a day (QID) | ORAL | Status: DC | PRN

## 2024-05-08 NOTE — Progress Notes (Signed)
 Occupational Therapy Treatment Patient Details Name: Margaret Rocha MRN: [id number] DOB: 12/17/1945 Today's Date: 05/08/2024   History of present illness 78 y.o. female, admitted 5/29 with Acute respiratory failure with hypoxia, likely due to PNA. PMH: diabetes mellitus, hypertension, hyperlipidemia, obstructive sleep apnea, noncompliant with CPAP, recent hospitalization at [facility] March 2025, secondary to influenza pneumonia/colitis, she was discharged home with home oxygen, but weaned off oxygen after month.   OT comments  Pt states she does prefer RW to her [facility], feels more steady. Pt supervised with RW around room, to bathroom for toileting and to sink for 2 grooming activities. Educated pt in availability of tub transfer bench for energy conservation and to avoid risk of fall stepping over edge of tub. Pt is aware bench is a private pay item and can be ordered online. Pt report already having a hand held shower head. Continue to recommend HHOT, pt likely to refuse.      If plan is discharge home, recommend the following:  A little help with walking and/or transfers;A lot of help with bathing/dressing/bathroom;Assistance with cooking/housework;Assist for transportation;Help with stairs or ramp for entrance;Direct supervision/assist for financial management;Direct supervision/assist for medications management   Equipment Recommendations       Recommendations for Other Services      Precautions / Restrictions Precautions Precautions: Fall Recall of Precautions/Restrictions: Intact Restrictions Weight Bearing Restrictions Per Provider Order: No       Mobility Bed Mobility                    Transfers Overall transfer level: Needs assistance Equipment used: Rolling walker (2 wheels) Transfers: Sit to/from Stand Sit to Stand: Supervision           General transfer comment: supervised for safety and IV pole     Balance Overall balance assessment: Needs  assistance   Sitting balance-Leahy Scale: Good       Standing balance-Leahy Scale: Poor Standing balance comment: RW for ambulation, stabilizes on elbows when washing hands at sink                           ADL either performed or assessed with clinical judgement   ADL Overall ADL's : Needs assistance/impaired     Grooming: Wash/dry hands;Brushing hair;Standing;Supervision/safety                   Toilet Transfer: Supervision/safety;Ambulation;Comfort height toilet;Rolling walker (2 wheels)   Toileting- Clothing Manipulation and Hygiene: Supervision/safety;Sit to/from stand       Functional mobility during ADLs: Supervision/safety;Rolling walker (2 wheels) General ADL Comments: Educated pt and showed her images of tub transfer bench to avoid pt having to step over the edge of her tub and run the risk of a fall. Pt receptive.    Extremity/Trunk Assessment              Vision       Restaurant manager, fast food Communication: No apparent difficulties   Cognition Arousal: Alert Behavior During Therapy: WFL for tasks assessed/performed Cognition: Cognition impaired       Memory impairment (select all impairments): Short-term memory   Executive functioning impairment (select all impairments): Problem solving                   Following commands: Intact        Cueing   Cueing Techniques: Verbal cues  Exercises  Shoulder Instructions       General Comments      Pertinent Vitals/ Pain       Pain Assessment Pain Assessment: No/denies pain  Home Living                                          Prior Functioning/Environment              Frequency  Min 2X/week        Progress Toward Goals  OT Goals(current goals can now be found in the care plan section)  Progress towards OT goals: Progressing toward goals  Acute Rehab OT Goals OT Goal Formulation: With  patient Time For Goal Achievement: 05/21/24 Potential to Achieve Goals: Good  Plan      Co-evaluation                 AM-PAC OT "6 Clicks" Daily Activity     Outcome Measure   Help from another person eating meals?: None Help from another person taking care of personal grooming?: A Little Help from another person toileting, which includes using toliet, bedpan, or urinal?: A Little Help from another person bathing (including washing, rinsing, drying)?: A Little Help from another person to put on and taking off regular upper body clothing?: None Help from another person to put on and taking off regular lower body clothing?: A Little 6 Click Score: 20    End of Session Equipment Utilized During Treatment: Rolling walker (2 wheels)  OT Visit Diagnosis: Unsteadiness on feet (R26.81);Other symptoms and signs involving cognitive function   Activity Tolerance Patient tolerated treatment well   Patient Left in chair;with call bell/phone within reach;with nursing/sitter in room;with family/visitor present   Nurse Communication          Time: 2841-3244 OT Time Calculation (min): 24 min  Charges: OT General Charges $OT Visit: 1 Visit OT Treatments $Self Care/Home Management : 23-37 mins  Avanell Leigh, OTR/L Acute Rehabilitation Services Office: [phone]   Jonette Nestle 05/08/2024, 11:33 AM

## 2024-05-08 NOTE — Progress Notes (Signed)
 PROGRESS NOTE  Margaret Rocha:[id number] DOB: 1946/03/19   PCP: Olan Bering, MD  Patient is from: Home.  Lives with daughter.  Uses cane at baseline  DOA: 05/07/2024 LOS: 1  Chief complaints Chief Complaint  Patient presents with   Altered Mental Status     Brief Narrative / Interim history: 78 year old F with PMH of OSA noncompliant with CPAP, central tremor, DM-2, HTN and HLD presenting with confusion, poor p.o. intake, generalized weakness and increased tremor, and admitted with working diagnosis of acute metabolic encephalopathy in the setting of community-acquired pneumonia with chest x-ray suggesting LLL infiltrate.  She had leukocytosis with tachypnea, lactic acidosis and encephalopathy meeting criteria for severe sepsis.  Blood cultures obtained.  Started on ceftriaxone  and Zithromax and admitted for further care.  Subjective: Seen and examined earlier this morning.  No major events overnight or this morning.  Reports improvement in confusion.  Denies shortness of breath.  Had some cough last night.  Denies chest pain.  Objective: Vitals:   05/07/24 2013 05/08/24 0404 05/08/24 0740 05/08/24 0857  BP: 133/64 (!) 127/57 (!) 151/85 (!) 151/85  Pulse: 68 65 74   Resp: 18 16 16    Temp: 97.7 F (36.5 C) 97.6 F (36.4 C) 98.4 F (36.9 C)   TempSrc: Oral Oral Oral   SpO2: 92% 92% 92%     Examination:  GENERAL: No apparent distress.  Nontoxic. HEENT: MMM.  Vision and hearing grossly intact.  NECK: Supple.  No apparent JVD.  RESP:  No IWOB.  Fair aeration bilaterally. CVS:  RRR. Heart sounds normal.  ABD/GI/GU: BS+. Abd soft, NTND.  MSK/EXT:  Moves extremities. No apparent deformity.  Trace edema bilaterally. SKIN: no apparent skin lesion or wound NEURO: AA.  Oriented appropriately.  Essential tremor.  No apparent focal neuro deficit. PSYCH: Calm. Normal affect.   Consultants:  None  Procedures: None  Microbiology summarized: Blood culture with staph  epidermis in 1 out of 4 bottles.  Assessment and plan: Severe sepsis due to community-acquired pneumonia: Present on arrival.  Had leukocytosis, tachypnea, lactic acidosis and encephalopathy on arrival.  CXR with left mid and lower lung airspace opacities concerning for pneumonia.  Blood culture with staph epidermis in 1 of 4 bottles likely contaminant.  Urine strep pneumo antigen negative.  Clinically improving. -Follow morning labs -Continue ceftriaxone , Zithromax, incentive spirometry, flutter valve -Follow urine Legionella antigen -OOB, PT/OT  Acute metabolic encephalopathy: Likely due to the above.  CT head without acute finding.  TSH and B12 normal.  Not on sedating medications.  No focal neurodeficit.  She has central tremor at baseline.Encephalopathy resolved.  -Reorientation and delirium precautions -Check VBG given history of OSA and noncompliance with CPAP - PT/OT   NIDDM-2 with hyperglycemia: A1c 6.5%.  On low-dose metformin at home. Recent Labs  Lab 05/07/24 0803 05/07/24 1157 05/07/24 1623 05/07/24 2023 05/08/24 0813  GLUCAP 166* 129* 192* 126* 209*  -Start SSI-moderate -Hold home metformin.  OSA: Noncompliant with CPAP.  Per daughter,  she had her machine fixed recently.  She did not wear CPAP last night. -Check VBG - Encourage CPAP use   Hypertension: BP slightly elevated this morning. - Continue home losartan and amlodipine -Add as needed hydralazine   Weakness/deconditioning - PT/OT recommended home health and rolling walker  Essential tremor: Chronic.  Does not seem to be on meds.  Hyperlipidemia - Continue with home dose statin and fish oil  GERD - Continue with PPI     There is no  height or weight on file to calculate BMI.          DVT prophylaxis:  enoxaparin (LOVENOX) injection 40 mg Start: 05/08/24 1000  Code Status: DNR Family Communication: None at bedside Level of care: Med-Surg Status is: Inpatient Remains inpatient appropriate  because: Severe sepsis due to pneumonia   Final disposition: Home   35 minutes with more than 50% spent in reviewing records, counseling patient/family and coordinating care.   Sch Meds:  Scheduled Meds:  acidophilus  1 capsule Oral Daily   amLODipine  10 mg Oral Daily   aspirin  81 mg Oral Daily   azithromycin  500 mg Oral Daily   enoxaparin (LOVENOX) injection  40 mg Subcutaneous Q24H   fluticasone  1 spray Each Nare Daily   glipiZIDE  2.5 mg Oral Q breakfast   insulin aspart  0-15 Units Subcutaneous TID WC   insulin aspart  0-5 Units Subcutaneous QHS   ipratropium-albuterol   3 mL Nebulization TID   losartan  100 mg Oral Daily   pantoprazole  40 mg Oral Daily   pravastatin  80 mg Oral Daily   sodium chloride  flush  3 mL Intravenous Q12H   Continuous Infusions:  sodium chloride      cefTRIAXone  (ROCEPHIN )  IV 2 g (05/08/24 0903)   PRN Meds:.sodium chloride , acetaminophen , albuterol , guaiFENesin-dextromethorphan, hydrALAZINE, melatonin, ondansetron , polyethylene glycol, sodium chloride  flush  Antimicrobials: Anti-infectives (From admission, onward)    Start     Dose/Rate Route Frequency Ordered Stop   05/08/24 1000  azithromycin (ZITHROMAX) 500 mg in sodium chloride  0.9 % 250 mL IVPB  Status:  Discontinued        500 mg 250 mL/hr over 60 Minutes Intravenous Every 24 hours 05/07/24 0610 05/07/24 1149   05/08/24 1000  cefTRIAXone  (ROCEPHIN ) 2 g in sodium chloride  0.9 % 100 mL IVPB        2 g 200 mL/hr over 30 Minutes Intravenous Every 24 hours 05/07/24 0758     05/08/24 1000  azithromycin (ZITHROMAX) tablet 500 mg        500 mg Oral Daily 05/07/24 1045 05/13/24 0959   05/07/24 2200  cefTRIAXone  (ROCEPHIN ) 1 g in sodium chloride  0.9 % 100 mL IVPB  Status:  Discontinued        1 g 200 mL/hr over 30 Minutes Intravenous Every 24 hours 05/07/24 0610 05/07/24 0758   05/07/24 0900  cefTRIAXone  (ROCEPHIN ) 1 g in sodium chloride  0.9 % 100 mL IVPB        1 g 200 mL/hr over 30  Minutes Intravenous  Once 05/07/24 0814 05/07/24 1146   05/07/24 0430  cefTRIAXone  (ROCEPHIN ) 1 g in sodium chloride  0.9 % 100 mL IVPB        1 g 200 mL/hr over 30 Minutes Intravenous  Once 05/07/24 0424 05/07/24 0552   05/07/24 0430  azithromycin (ZITHROMAX) 500 mg in sodium chloride  0.9 % 250 mL IVPB        500 mg 250 mL/hr over 60 Minutes Intravenous  Once 05/07/24 0424 05/07/24 0649        I have personally reviewed the following labs and images: CBC: Recent Labs  Lab 05/07/24 0238  WBC 11.0*  NEUTROABS 7.7  HGB 14.3  HCT 43.2  MCV 92.5  PLT 223   BMP &GFR Recent Labs  Lab 05/07/24 0238  NA 139  K 4.0  CL 105  CO2 25  GLUCOSE 176*  BUN 8  CREATININE 0.68  CALCIUM 9.4  MG 1.4*  PHOS 3.3   CrCl cannot be calculated (Unknown ideal weight.). Liver & Pancreas: Recent Labs  Lab 05/07/24 0238  AST 32  ALT 25  ALKPHOS 72  BILITOT 0.9  PROT 6.4*  ALBUMIN 3.7   No results for input(s): "LIPASE", "AMYLASE" in the last 168 hours. No results for input(s): "AMMONIA" in the last 168 hours. Diabetic: Recent Labs    05/07/24 0706  HGBA1C 6.5*   Recent Labs  Lab 05/07/24 0803 05/07/24 1157 05/07/24 1623 05/07/24 2023 05/08/24 0813  GLUCAP 166* 129* 192* 126* 209*   Cardiac Enzymes: No results for input(s): "CKTOTAL", "CKMB", "CKMBINDEX", "TROPONINI" in the last 168 hours. No results for input(s): "PROBNP" in the last 8760 hours. Coagulation Profile: Recent Labs  Lab 05/07/24 0238  INR 1.1   Thyroid  Function Tests: Recent Labs    05/07/24 0238  TSH 2.375   Lipid Profile: No results for input(s): "CHOL", "HDL", "LDLCALC", "TRIG", "CHOLHDL", "LDLDIRECT" in the last 72 hours. Anemia Panel: Recent Labs    05/07/24 0238  VITAMINB12 277   Urine analysis:    Component Value Date/Time   COLORURINE YELLOW 05/07/2024 0304   APPEARANCEUR CLEAR 05/07/2024 0304   LABSPEC 1.011 05/07/2024 0304   PHURINE 7.0 05/07/2024 0304   GLUCOSEU NEGATIVE  05/07/2024 0304   HGBUR NEGATIVE 05/07/2024 0304   BILIRUBINUR NEGATIVE 05/07/2024 0304   KETONESUR NEGATIVE 05/07/2024 0304   PROTEINUR 30 (A) 05/07/2024 0304   UROBILINOGEN 0.2 09/18/2010 1120   NITRITE POSITIVE (A) 05/07/2024 0304   LEUKOCYTESUR NEGATIVE 05/07/2024 0304   Sepsis Labs: Invalid input(s): "PROCALCITONIN", "LACTICIDVEN"  Microbiology: Recent Results (from the past 240 hours)  Blood Culture (routine x 2)     Status: None (Preliminary result)   Collection Time: 05/07/24  3:38 AM   Specimen: BLOOD LEFT FOREARM  Result Value Ref Range Status   Specimen Description BLOOD LEFT FOREARM  Final   Special Requests   Final    BOTTLES DRAWN AEROBIC AND ANAEROBIC Blood Culture results may not be optimal due to an inadequate volume of blood received in culture bottles   Culture  Setup Time   Final    GRAM POSITIVE COCCI IN CLUSTERS AEROBIC BOTTLE ONLY CRITICAL RESULT CALLED TO, READ BACK BY AND VERIFIED WITH: PHARMD G ABBOTT 05/08/2024 @ 0146 BY AB Performed at [facility], [address] [place], [place] [address]    Culture GRAM POSITIVE COCCI  Final   Report Status PENDING  Incomplete  Blood Culture ID Panel (Reflexed)     Status: Abnormal   Collection Time: 05/07/24  3:38 AM  Result Value Ref Range Status   Enterococcus faecalis NOT DETECTED NOT DETECTED Final   Enterococcus Faecium NOT DETECTED NOT DETECTED Final   Listeria monocytogenes NOT DETECTED NOT DETECTED Final   Staphylococcus species DETECTED (A) NOT DETECTED Final    Comment: CRITICAL RESULT CALLED TO, READ BACK BY AND VERIFIED WITH: PHARMD G ABBOTT 05/08/2024 @ 0146 BY AB    Staphylococcus aureus (BCID) NOT DETECTED NOT DETECTED Final   Staphylococcus epidermidis DETECTED (A) NOT DETECTED Final    Comment: CRITICAL RESULT CALLED TO, READ BACK BY AND VERIFIED WITH: PHARMD G ABBOTT 05/08/2024 @ 0146 BY AB    Staphylococcus lugdunensis NOT DETECTED NOT DETECTED Final   Streptococcus species NOT  DETECTED NOT DETECTED Final   Streptococcus agalactiae NOT DETECTED NOT DETECTED Final   Streptococcus pneumoniae NOT DETECTED NOT DETECTED Final   Streptococcus pyogenes NOT DETECTED NOT DETECTED Final   A.calcoaceticus-baumannii  NOT DETECTED NOT DETECTED Final   Bacteroides fragilis NOT DETECTED NOT DETECTED Final   Enterobacterales NOT DETECTED NOT DETECTED Final   Enterobacter cloacae complex NOT DETECTED NOT DETECTED Final   Escherichia coli NOT DETECTED NOT DETECTED Final   Klebsiella aerogenes NOT DETECTED NOT DETECTED Final   Klebsiella oxytoca NOT DETECTED NOT DETECTED Final   Klebsiella pneumoniae NOT DETECTED NOT DETECTED Final   Proteus species NOT DETECTED NOT DETECTED Final   Salmonella species NOT DETECTED NOT DETECTED Final   Serratia marcescens NOT DETECTED NOT DETECTED Final   Haemophilus influenzae NOT DETECTED NOT DETECTED Final   Neisseria meningitidis NOT DETECTED NOT DETECTED Final   Pseudomonas aeruginosa NOT DETECTED NOT DETECTED Final   Stenotrophomonas maltophilia NOT DETECTED NOT DETECTED Final   Candida albicans NOT DETECTED NOT DETECTED Final   Candida auris NOT DETECTED NOT DETECTED Final   Candida glabrata NOT DETECTED NOT DETECTED Final   Candida krusei NOT DETECTED NOT DETECTED Final   Candida parapsilosis NOT DETECTED NOT DETECTED Final   Candida tropicalis NOT DETECTED NOT DETECTED Final   Cryptococcus neoformans/gattii NOT DETECTED NOT DETECTED Final   Methicillin resistance mecA/C NOT DETECTED NOT DETECTED Final    Comment: Performed at [facility], [address], [place], [place] [address]  Blood Culture (routine x 2)     Status: None (Preliminary result)   Collection Time: 05/07/24  3:45 AM   Specimen: BLOOD  Result Value Ref Range Status   Specimen Description BLOOD RIGHT ANTECUBITAL  Final   Special Requests   Final    BOTTLES DRAWN AEROBIC AND ANAEROBIC Blood Culture results may not be optimal due to an inadequate volume of blood  received in culture bottles   Culture   Final    NO GROWTH 1 DAY Performed at [facility], [address], [place], [place] [address]    Report Status PENDING  Incomplete    Radiology Studies: No results found.    Halsey Persaud T. Keyaan Lederman [facility]  If 7PM-7AM, please contact night-coverage [email or web address] 05/08/2024, 10:01 AM

## 2024-05-08 NOTE — Progress Notes (Signed)
   05/08/24 2055  BiPAP/CPAP/SIPAP  Reason BIPAP/CPAP not in use Non-compliant   Patient refused use of CPAP for the evening.

## 2024-05-08 NOTE — Plan of Care (Signed)
  Problem: Coping: Goal: Ability to adjust to condition or change in health will improve Outcome: Progressing   Problem: Health Behavior/Discharge Planning: Goal: Ability to identify and utilize available resources and services will improve Outcome: Progressing   Problem: Nutritional: Goal: Maintenance of adequate nutrition will improve Outcome: Progressing   Problem: Skin Integrity: Goal: Risk for impaired skin integrity will decrease Outcome: Progressing   Problem: Health Behavior/Discharge Planning: Goal: Ability to manage health-related needs will improve Outcome: Progressing   Problem: Activity: Goal: Risk for activity intolerance will decrease Outcome: Progressing   Problem: Nutrition: Goal: Adequate nutrition will be maintained Outcome: Progressing

## 2024-05-08 NOTE — Progress Notes (Signed)
 PHARMACY - PHYSICIAN COMMUNICATION CRITICAL VALUE ALERT - BLOOD CULTURE IDENTIFICATION (BCID)  Margaret Rocha is an 78 y.o. female who presented to [facility] on 05/07/2024 with a chief complaint of AMS/PNA  Assessment: 1/2 blood cultures growing Staph epidermidis--likely contaminant  Name of physician (or Provider) Contacted:  Dr. Brice Campi  Current antibiotics:  Rocephin   Changes to prescribed antibiotics recommended:  No change needed at this time  Results for orders placed or performed during the hospital encounter of 05/07/24  Blood Culture ID Panel (Reflexed) (Collected: 05/07/2024  3:38 AM)  Result Value Ref Range   Enterococcus faecalis NOT DETECTED NOT DETECTED   Enterococcus Faecium NOT DETECTED NOT DETECTED   Listeria monocytogenes NOT DETECTED NOT DETECTED   Staphylococcus species DETECTED (A) NOT DETECTED   Staphylococcus aureus (BCID) NOT DETECTED NOT DETECTED   Staphylococcus epidermidis DETECTED (A) NOT DETECTED   Staphylococcus lugdunensis NOT DETECTED NOT DETECTED   Streptococcus species NOT DETECTED NOT DETECTED   Streptococcus agalactiae NOT DETECTED NOT DETECTED   Streptococcus pneumoniae NOT DETECTED NOT DETECTED   Streptococcus pyogenes NOT DETECTED NOT DETECTED   A.calcoaceticus-baumannii NOT DETECTED NOT DETECTED   Bacteroides fragilis NOT DETECTED NOT DETECTED   Enterobacterales NOT DETECTED NOT DETECTED   Enterobacter cloacae complex NOT DETECTED NOT DETECTED   Escherichia coli NOT DETECTED NOT DETECTED   Klebsiella aerogenes NOT DETECTED NOT DETECTED   Klebsiella oxytoca NOT DETECTED NOT DETECTED   Klebsiella pneumoniae NOT DETECTED NOT DETECTED   Proteus species NOT DETECTED NOT DETECTED   Salmonella species NOT DETECTED NOT DETECTED   Serratia marcescens NOT DETECTED NOT DETECTED   Haemophilus influenzae NOT DETECTED NOT DETECTED   Neisseria meningitidis NOT DETECTED NOT DETECTED   Pseudomonas aeruginosa NOT DETECTED NOT DETECTED    Stenotrophomonas maltophilia NOT DETECTED NOT DETECTED   Candida albicans NOT DETECTED NOT DETECTED   Candida auris NOT DETECTED NOT DETECTED   Candida glabrata NOT DETECTED NOT DETECTED   Candida krusei NOT DETECTED NOT DETECTED   Candida parapsilosis NOT DETECTED NOT DETECTED   Candida tropicalis NOT DETECTED NOT DETECTED   Cryptococcus neoformans/gattii NOT DETECTED NOT DETECTED   Methicillin resistance mecA/C NOT DETECTED NOT DETECTED    [name] 05/08/2024  3:43 AM

## 2024-05-08 NOTE — Plan of Care (Signed)
  Problem: Coping: Goal: Ability to adjust to condition or change in health will improve Outcome: Progressing   Problem: Nutritional: Goal: Maintenance of adequate nutrition will improve Outcome: Progressing   Problem: Tissue Perfusion: Goal: Adequacy of tissue perfusion will improve Outcome: Progressing   Problem: Clinical Measurements: Goal: Ability to maintain clinical measurements within normal limits will improve Outcome: Progressing Goal: Respiratory complications will improve Outcome: Progressing   Problem: Activity: Goal: Risk for activity intolerance will decrease Outcome: Progressing   Problem: Nutrition: Goal: Adequate nutrition will be maintained Outcome: Progressing

## 2024-05-09 DIAGNOSIS — I10 Essential (primary) hypertension: Secondary | ICD-10-CM | POA: Diagnosis not present

## 2024-05-09 DIAGNOSIS — K219 Gastro-esophageal reflux disease without esophagitis: Secondary | ICD-10-CM | POA: Diagnosis not present

## 2024-05-09 DIAGNOSIS — G4733 Obstructive sleep apnea (adult) (pediatric): Secondary | ICD-10-CM

## 2024-05-09 DIAGNOSIS — R6520 Severe sepsis without septic shock: Secondary | ICD-10-CM

## 2024-05-09 DIAGNOSIS — A419 Sepsis, unspecified organism: Secondary | ICD-10-CM | POA: Diagnosis not present

## 2024-05-09 DIAGNOSIS — J189 Pneumonia, unspecified organism: Secondary | ICD-10-CM | POA: Diagnosis not present

## 2024-05-09 LAB — CBC
HCT: 38.7 % (ref 36.0–46.0)
Hemoglobin: 12.8 g/dL (ref 12.0–15.0)
MCH: 30.5 pg (ref 26.0–34.0)
MCHC: 33.1 g/dL (ref 30.0–36.0)
MCV: 92.1 fL (ref 80.0–100.0)
Platelets: 194 10*3/uL (ref 150–400)
RBC: 4.2 MIL/uL (ref 3.87–5.11)
RDW: 13.4 % (ref 11.5–15.5)
WBC: 8.2 10*3/uL (ref 4.0–10.5)
nRBC: 0 % (ref 0.0–0.2)

## 2024-05-09 LAB — RENAL FUNCTION PANEL
Albumin: 2.8 g/dL — ABNORMAL LOW (ref 3.5–5.0)
Anion gap: 5 (ref 5–15)
BUN: 13 mg/dL (ref 8–23)
CO2: 26 mmol/L (ref 22–32)
Calcium: 8.4 mg/dL — ABNORMAL LOW (ref 8.9–10.3)
Chloride: 109 mmol/L (ref 98–111)
Creatinine, Ser: 0.86 mg/dL (ref 0.44–1.00)
GFR, Estimated: >60 mL/min (ref >60)
Glucose, Bld: 134 mg/dL — ABNORMAL HIGH (ref 70–99)
Phosphorus: 2.9 mg/dL (ref 2.5–4.6)
Potassium: 4.2 mmol/L (ref 3.5–5.1)
Sodium: 140 mmol/L (ref 135–145)

## 2024-05-09 LAB — MAGNESIUM: Magnesium: 1.8 mg/dL (ref 1.7–2.4)

## 2024-05-09 LAB — CULTURE, BLOOD (ROUTINE X 2)

## 2024-05-09 LAB — GLUCOSE, CAPILLARY: Glucose-Capillary: 226 mg/dL — ABNORMAL HIGH (ref 70–99)

## 2024-05-09 MED ORDER — AMOXICILLIN-POT CLAVULANATE 875-125 MG PO TABS
1.0000 | ORAL_TABLET | Freq: Two times a day (BID) | ORAL | 0 refills | Status: AC
Start: 2024-05-10 — End: 2024-05-12

## 2024-05-09 MED ORDER — AZITHROMYCIN 500 MG PO TABS
500.0000 mg | ORAL_TABLET | Freq: Every day | ORAL | Status: DC
  Administered 2024-05-09: 500 mg via ORAL
  Filled 2024-05-09: qty 2
  Filled 2024-05-09: qty 1

## 2024-05-09 MED ORDER — SODIUM CHLORIDE 0.9 % IV SOLN
2.0000 g | INTRAVENOUS | Status: DC
  Administered 2024-05-09: 2 g via INTRAVENOUS
  Filled 2024-05-09: qty 20

## 2024-05-09 NOTE — TOC Transition Note (Signed)
 Transition of Care [facility]) - Discharge Note   Patient Details  Name: Margaret Rocha MRN: [id number] Date of Birth: August 09, 1946  [facility] of [facility]) CM/SW Contact:  Jannine Meo, RN Phone Number: 05/09/2024, 9:45 AM   Clinical Narrative:   Patient is being discharged today. Bartholomew Light with [facility] made aware.    Final next level of care: Home w Home Health Services Barriers to Discharge: No Barriers Identified   Patient Goals and CMS Choice Patient states their goals for this hospitalization and ongoing recovery are:: to return to home CMS Medicare.gov Compare Post Acute Care list provided to:: Patient Choice offered to / list presented to : Patient, Adult Children      Discharge Placement                       Discharge Plan and Services Additional resources added to the After Visit Summary for     Discharge Planning Services: CM Consult Post Acute Care Choice: Home Health, Durable Medical Equipment          DME Arranged: Walker rolling DME Agency: [employer] Date DME Agency Contacted: 05/07/24 Time DME Agency Contacted: 1300 Representative spoke with at DME Agency: [facility] HH Arranged: PT, OT HH Agency: [facility] Date [facility] Agency Contacted: 05/07/24 Time HH Agency Contacted: 1301 Representative spoke with at [facility] Agency: Bartholomew Light  Social Drivers of Health (SDOH) Interventions SDOH Screenings   Food Insecurity: No Food Insecurity (05/07/2024)  Housing: Low Risk  (05/07/2024)  Transportation Needs: No Transportation Needs (05/07/2024)  Utilities: Not At Risk (05/07/2024)  Social Connections: Unknown (05/07/2024)  Tobacco Use: Low Risk  (12/08/2020)     Readmission Risk Interventions     No data to display

## 2024-05-09 NOTE — Progress Notes (Signed)
 The patient's daughter Catherin Closs) called this RN and notified me that if the patient gets discharged tomorrow she will not be available  to get the patient between 12pm to 5pm.

## 2024-05-09 NOTE — Discharge Summary (Signed)
 Physician Discharge Summary  Margaret Rocha ZOX:[id number] DOB: 1946-04-24 DOA: 05/07/2024  PCP: Olan Bering, MD  Admit date: 05/07/2024 Discharge date: 05/09/24  Admitted From: Home Disposition: Home Recommendations for Outpatient Follow-up:  Follow up with PCP in 1 week Check CMP and CBC in 1 week Please follow up on the following pending results: None  Home Health: [facility] PT/OT Equipment/Devices: Patient has appropriate DME's  Discharge Condition: Stable CODE STATUS: Full code  Follow-up Information     Care, [facility] Follow up.   Contact information: [address] [place] [address] [phone]                 Hospital course 78 year old F with PMH of OSA noncompliant with CPAP, central tremor, DM-2, HTN and HLD presenting with confusion, poor p.o. intake, generalized weakness and increased tremor, and admitted with working diagnosis of acute metabolic encephalopathy in the setting of community-acquired pneumonia with chest x-ray suggesting LLL infiltrate.  She had leukocytosis with tachypnea, lactic acidosis and encephalopathy meeting criteria for severe sepsis.  Blood cultures obtained.  Started on ceftriaxone  and Zithromax  and admitted for further care.   On the day of discharge, patient symptoms resolved and she is back to baseline.  Oriented x 4.  No respiratory distress.  Saturating in the mid 90s on room air.  She completed 3 days of ceftriaxone  and Zithromax  and discharged on p.o. Augmentin for 2 more days.  Home health PT/OT ordered as recommended by therapy.  Blood culture with Staph epidermidis in 1 out of 4 bottles likely contaminant.  See individual problem list below for more.   Problems addressed during this hospitalization Severe sepsis due to community-acquired pneumonia: Present on arrival.  Had leukocytosis, tachypnea, lactic acidosis and encephalopathy on arrival.  CXR with left mid and lower lung airspace opacities  concerning for pneumonia.  Blood culture with staph epidermis in 1 of 4 bottles likely contaminant.  Urine strep pneumo antigen negative.  Sepsis physiology resolved.  -Received CTX and Zithromax  for 3 days and discharged on p.o. Augmentin for 2 more days.   Acute metabolic encephalopathy: Likely due to the above.  CT head without acute finding.  VBG, TSH and B12 normal.  Not on sedating medications.  No focal neurodeficit.  She has central tremor at baseline.Encephalopathy resolved.  Oriented x 4.  Positive blood culture: Blood culture with Staph epidermidis in 1 out of 4 bottles likely contaminant.   NIDDM-2 with hyperglycemia: A1c 6.5%.  On low-dose metformin at home. -Continue home metformin.   OSA: Noncompliant with CPAP.  Per daughter,  she had her machine fixed recently.  She did not wear CPAP last night.  VBG reassuring. - Encourage CPAP use   Hypertension: BP within acceptable range. -Continue home meds.    Weakness/deconditioning - PT/OT recommended home health and rolling walker   Essential tremor: Chronic.  Does not seem to be on meds.   Hyperlipidemia - Continue with home dose statin and fish oil  Lactic acidosis: Could be due to metformin versus sepsis.  She is also on trimethoprim which could increase serum concentration of metformin.  GERD - Continue with PPI         Time spent 35 minutes  Vital signs Vitals:   05/09/24 0254 05/09/24 0556 05/09/24 0811 05/09/24 0926  BP: 118/61  (!) 142/82   Pulse: 73  74 72  Temp: 97.9 F (36.6 C)  97.6 F (36.4 C)   Resp: 19  18 16  Weight:  92.4 kg Comment: weighing scale    SpO2: 94%  95% 96%  TempSrc: Oral  Oral      Discharge exam  GENERAL: No apparent distress.  Nontoxic. HEENT: MMM.  Vision and hearing grossly intact.  NECK: Supple.  No apparent JVD.  RESP:  No IWOB.  Fair aeration bilaterally. CVS:  RRR. Heart sounds normal.  ABD/GI/GU: BS+. Abd soft, NTND.  MSK/EXT:  Moves extremities. No apparent  deformity. No edema.  SKIN: no apparent skin lesion or wound NEURO: Awake and alert. Oriented x 4.  Central tremor.  No apparent focal neuro deficit. PSYCH: Calm. Normal affect.   Discharge Instructions Discharge Instructions     Diet - low sodium heart healthy   Complete by: As directed    Diet Carb Modified   Complete by: As directed    Discharge instructions   Complete by: As directed    It has been a pleasure taking care of you!  You were hospitalized due to pneumonia and confusion.  You have been treated with antibiotics.  Your symptoms improved.  We are discharging you on more antibiotics to complete treatment course.  Please review your new medication list and the directions on your medications before you take them.  Follow-up with your primary care doctor in 1 to 2 weeks or sooner if needed   Take care,   Increase activity slowly   Complete by: As directed       Allergies as of 05/09/2024       Reactions   Prednisone Anaphylaxis, Other (See Comments)   Psychosis;         Medication List     TAKE these medications    ACIDOPHILUS LACTOBACILLUS PO Take 1 tablet by mouth daily. (10mg ; 2 billion active cultures) Take one tablet by mouth daily.   albuterol  108 (90 Base) MCG/ACT inhaler Commonly known as: VENTOLIN  HFA Inhale 1-2 puffs into the lungs every 6 (six) hours as needed.   amLODipine  5 MG tablet Commonly known as: NORVASC  Take 5 mg by mouth daily.   amoxicillin-clavulanate 875-125 MG tablet Commonly known as: AUGMENTIN Take 1 tablet by mouth 2 (two) times daily for 2 days. Start taking on: May 10, 2024   aspirin  81 MG chewable tablet Chew 81 mg by mouth daily.   FISH OIL HIGH POTENCY PO Take 2,000 mg by mouth daily. Fish Oil 2000mg  (600mg  Omega-3).  Takel one capsule by mouth daily.   losartan  100 MG tablet Commonly known as: COZAAR  Take 100 mg by mouth daily.   Magnesium  Oxide 250 MG Tabs Take 1 tablet by mouth at bedtime. Take one tablet by  mouth every evening at bedtime.   metFORMIN 500 MG 24 hr tablet Commonly known as: GLUCOPHAGE-XR Take 1,000 mg by mouth 2 (two) times daily.   nystatin cream Commonly known as: MYCOSTATIN Apply 1 Application topically 2 (two) times daily.   omeprazole 20 MG capsule Commonly known as: PRILOSEC Take 20 mg by mouth at bedtime.   pravastatin  80 MG tablet Commonly known as: PRAVACHOL  Take 80 mg by mouth daily.   trimethoprim 100 MG tablet Commonly known as: TRIMPEX Take 100 mg by mouth daily. Take one tablet by mouth once daily for UTI prevention.        Consultations: None  Procedures/Studies:   CT Head Wo Contrast Result Date: 05/07/2024 CLINICAL DATA:  Headache and confusion. EXAM: CT HEAD WITHOUT CONTRAST TECHNIQUE: Contiguous axial images were obtained from the base of the skull through  the vertex without intravenous contrast. RADIATION DOSE REDUCTION: This exam was performed according to the departmental dose-optimization program which includes automated exposure control, adjustment of the mA and/or kV according to patient size and/or use of iterative reconstruction technique. COMPARISON:  March 05, 2024 FINDINGS: Brain: There is generalized cerebral atrophy with widening of the extra-axial spaces and ventricular dilatation. There are areas of decreased attenuation within the white matter tracts of the supratentorial brain, consistent with microvascular disease changes. Vascular: Marked severity calcification of the bilateral cavernous carotid arteries is noted. Skull: Normal. Negative for fracture or focal lesion. Sinuses/Orbits: No acute finding. Other: None. IMPRESSION: 1. Generalized cerebral atrophy and microvascular disease changes of the supratentorial brain. 2. No acute intracranial abnormality. Electronically Signed   By: Virgle Grime M.D.   On: 05/07/2024 04:16   DG Chest Port 1 View Result Date: 05/07/2024 CLINICAL DATA:  Questionable sepsis, altered mental status  EXAM: PORTABLE CHEST 1 VIEW COMPARISON:  03/05/2024 FINDINGS: Stable cardiomegaly. Pulmonary vascular congestion. Airspace opacities in the left mid and lower lung suspicious for pneumonia. No pleural effusion or pneumothorax. IMPRESSION: Left mid and lower lung airspace opacities suspicious for pneumonia. Electronically Signed   By: Rozell Cornet M.D.   On: 05/07/2024 03:47       The results of significant diagnostics from this hospitalization (including imaging, microbiology, ancillary and laboratory) are listed below for reference.     Microbiology: Recent Results (from the past 240 hours)  Blood Culture (routine x 2)     Status: Abnormal   Collection Time: 05/07/24  3:38 AM   Specimen: BLOOD LEFT FOREARM  Result Value Ref Range Status   Specimen Description BLOOD LEFT FOREARM  Final   Special Requests   Final    BOTTLES DRAWN AEROBIC AND ANAEROBIC Blood Culture results may not be optimal due to an inadequate volume of blood received in culture bottles   Culture  Setup Time   Final    GRAM POSITIVE COCCI IN CLUSTERS AEROBIC BOTTLE ONLY CRITICAL RESULT CALLED TO, READ BACK BY AND VERIFIED WITH: PHARMD G ABBOTT 05/08/2024 @ 0146 BY AB    Culture (A)  Final    STAPHYLOCOCCUS EPIDERMIDIS THE SIGNIFICANCE OF ISOLATING THIS ORGANISM FROM A SINGLE SET OF BLOOD CULTURES WHEN MULTIPLE SETS ARE DRAWN IS UNCERTAIN. PLEASE NOTIFY THE MICROBIOLOGY DEPARTMENT WITHIN ONE WEEK IF SPECIATION AND SENSITIVITIES ARE REQUIRED. Performed at [facility], [address] [place], [place] [address]    Report Status 05/09/2024 FINAL  Final  Blood Culture ID Panel (Reflexed)     Status: Abnormal   Collection Time: 05/07/24  3:38 AM  Result Value Ref Range Status   Enterococcus faecalis NOT DETECTED NOT DETECTED Final   Enterococcus Faecium NOT DETECTED NOT DETECTED Final   Listeria monocytogenes NOT DETECTED NOT DETECTED Final   Staphylococcus species DETECTED (A) NOT DETECTED Final    Comment:  CRITICAL RESULT CALLED TO, READ BACK BY AND VERIFIED WITH: PHARMD G ABBOTT 05/08/2024 @ 0146 BY AB    Staphylococcus aureus (BCID) NOT DETECTED NOT DETECTED Final   Staphylococcus epidermidis DETECTED (A) NOT DETECTED Final    Comment: CRITICAL RESULT CALLED TO, READ BACK BY AND VERIFIED WITH: PHARMD G ABBOTT 05/08/2024 @ 0146 BY AB    Staphylococcus lugdunensis NOT DETECTED NOT DETECTED Final   Streptococcus species NOT DETECTED NOT DETECTED Final   Streptococcus agalactiae NOT DETECTED NOT DETECTED Final   Streptococcus pneumoniae NOT DETECTED NOT DETECTED Final   Streptococcus pyogenes NOT DETECTED  NOT DETECTED Final   A.calcoaceticus-baumannii NOT DETECTED NOT DETECTED Final   Bacteroides fragilis NOT DETECTED NOT DETECTED Final   Enterobacterales NOT DETECTED NOT DETECTED Final   Enterobacter cloacae complex NOT DETECTED NOT DETECTED Final   Escherichia coli NOT DETECTED NOT DETECTED Final   Klebsiella aerogenes NOT DETECTED NOT DETECTED Final   Klebsiella oxytoca NOT DETECTED NOT DETECTED Final   Klebsiella pneumoniae NOT DETECTED NOT DETECTED Final   Proteus species NOT DETECTED NOT DETECTED Final   Salmonella species NOT DETECTED NOT DETECTED Final   Serratia marcescens NOT DETECTED NOT DETECTED Final   Haemophilus influenzae NOT DETECTED NOT DETECTED Final   Neisseria meningitidis NOT DETECTED NOT DETECTED Final   Pseudomonas aeruginosa NOT DETECTED NOT DETECTED Final   Stenotrophomonas maltophilia NOT DETECTED NOT DETECTED Final   Candida albicans NOT DETECTED NOT DETECTED Final   Candida auris NOT DETECTED NOT DETECTED Final   Candida glabrata NOT DETECTED NOT DETECTED Final   Candida krusei NOT DETECTED NOT DETECTED Final   Candida parapsilosis NOT DETECTED NOT DETECTED Final   Candida tropicalis NOT DETECTED NOT DETECTED Final   Cryptococcus neoformans/gattii NOT DETECTED NOT DETECTED Final   Methicillin resistance mecA/C NOT DETECTED NOT DETECTED Final    Comment:  Performed at [facility], [address] [place], [place] [address]  Blood Culture (routine x 2)     Status: None (Preliminary result)   Collection Time: 05/07/24  3:45 AM   Specimen: BLOOD  Result Value Ref Range Status   Specimen Description BLOOD RIGHT ANTECUBITAL  Final   Special Requests   Final    BOTTLES DRAWN AEROBIC AND ANAEROBIC Blood Culture results may not be optimal due to an inadequate volume of blood received in culture bottles   Culture   Final    NO GROWTH 2 DAYS Performed at [facility], [address], [place], [place] [address]    Report Status PENDING  Incomplete     Labs:  CBC: Recent Labs  Lab 05/07/24 0238 05/08/24 1057 05/09/24 0545  WBC 11.0* 9.9 8.2  NEUTROABS 7.7  --   --   HGB 14.3 13.5 12.8  HCT 43.2 41.1 38.7  MCV 92.5 92.2 92.1  PLT 223 201 194   BMP &GFR Recent Labs  Lab 05/07/24 0238 05/08/24 1057 05/09/24 0545  NA 139 139 140  K 4.0 3.1* 4.2  CL 105 106 109  CO2 25 21* 26  GLUCOSE 176* 172* 134*  BUN 8 9 13   CREATININE 0.68 0.55 0.86  CALCIUM 9.4 8.9 8.4*  MG 1.4* 1.6* 1.8  PHOS 3.3 2.3* 2.9   CrCl cannot be calculated (Unknown ideal weight.). Liver & Pancreas: Recent Labs  Lab 05/07/24 0238 05/08/24 1057 05/09/24 0545  AST 32  --   --   ALT 25  --   --   ALKPHOS 72  --   --   BILITOT 0.9  --   --   PROT 6.4*  --   --   ALBUMIN 3.7 3.3* 2.8*   No results for input(s): "LIPASE", "AMYLASE" in the last 168 hours. No results for input(s): "AMMONIA" in the last 168 hours. Diabetic: Recent Labs    05/07/24 0706  HGBA1C 6.5*   Recent Labs  Lab 05/08/24 0813 05/08/24 1159 05/08/24 1656 05/08/24 1935 05/09/24 0817  GLUCAP 209* 196* 197* 244* 226*   Cardiac Enzymes: No results for input(s): "CKTOTAL", "CKMB", "CKMBINDEX", "TROPONINI" in the last 168 hours. No results for  input(s): "PROBNP" in the last 8760 hours. Coagulation Profile: Recent Labs  Lab 05/07/24 0238  INR 1.1   Thyroid  Function  Tests: Recent Labs    05/07/24 0238  TSH 2.375   Lipid Profile: No results for input(s): "CHOL", "HDL", "LDLCALC", "TRIG", "CHOLHDL", "LDLDIRECT" in the last 72 hours. Anemia Panel: Recent Labs    05/07/24 0238  VITAMINB12 277   Urine analysis:    Component Value Date/Time   COLORURINE YELLOW 05/07/2024 0304   APPEARANCEUR CLEAR 05/07/2024 0304   LABSPEC 1.011 05/07/2024 0304   PHURINE 7.0 05/07/2024 0304   GLUCOSEU NEGATIVE 05/07/2024 0304   HGBUR NEGATIVE 05/07/2024 0304   BILIRUBINUR NEGATIVE 05/07/2024 0304   KETONESUR NEGATIVE 05/07/2024 0304   PROTEINUR 30 (A) 05/07/2024 0304   UROBILINOGEN 0.2 09/18/2010 1120   NITRITE POSITIVE (A) 05/07/2024 0304   LEUKOCYTESUR NEGATIVE 05/07/2024 0304   Sepsis Labs: Invalid input(s): "PROCALCITONIN", "LACTICIDVEN"   SIGNED:  Theadore Finger, MD  [facility] 05/09/2024, 9:56 PM

## 2024-05-09 NOTE — Plan of Care (Signed)
  Problem: Coping: Goal: Ability to adjust to condition or change in health will improve Outcome: Progressing   Problem: Nutritional: Goal: Maintenance of adequate nutrition will improve Outcome: Progressing   Problem: Education: Goal: Knowledge of General Education information will improve Description: Including pain rating scale, medication(s)/side effects and non-pharmacologic comfort measures Outcome: Progressing   Problem: Activity: Goal: Risk for activity intolerance will decrease Outcome: Progressing   Problem: Safety: Goal: Ability to remain free from injury will improve Outcome: Progressing   Problem: Activity: Goal: Ability to tolerate increased activity will improve Outcome: Progressing   Problem: Respiratory: Goal: Ability to maintain adequate ventilation will improve Outcome: Progressing

## 2024-05-12 LAB — CULTURE, BLOOD (ROUTINE X 2): Culture: NO GROWTH

## 2024-05-17 LAB — LEGIONELLA PNEUMOPHILA SEROGP 1 UR AG: L. pneumophila Serogp 1 Ur Ag: NEGATIVE

## 2024-10-27 ENCOUNTER — Emergency Department (HOSPITAL_COMMUNITY)
Admission: EM | Admit: 2024-10-27 | Discharge: 2024-10-27 | Discharge status: Against Medical Advice | Attending: Emergency Medicine | Admitting: Emergency Medicine

## 2024-10-27 ENCOUNTER — Other Ambulatory Visit: Payer: Self-pay

## 2024-10-27 ENCOUNTER — Encounter (HOSPITAL_COMMUNITY): Payer: Self-pay

## 2024-10-27 ENCOUNTER — Emergency Department (HOSPITAL_COMMUNITY)

## 2024-10-27 DIAGNOSIS — Z5329 Procedure and treatment not carried out because of patient's decision for other reasons: Secondary | ICD-10-CM | POA: Insufficient documentation

## 2024-10-27 DIAGNOSIS — W1811XA Fall from or off toilet without subsequent striking against object, initial encounter: Secondary | ICD-10-CM | POA: Insufficient documentation

## 2024-10-27 DIAGNOSIS — D72829 Elevated white blood cell count, unspecified: Secondary | ICD-10-CM | POA: Insufficient documentation

## 2024-10-27 DIAGNOSIS — Z7982 Long term (current) use of aspirin: Secondary | ICD-10-CM | POA: Diagnosis not present

## 2024-10-27 DIAGNOSIS — R519 Headache, unspecified: Secondary | ICD-10-CM | POA: Insufficient documentation

## 2024-10-27 DIAGNOSIS — W19XXXA Unspecified fall, initial encounter: Secondary | ICD-10-CM

## 2024-10-27 DIAGNOSIS — M791 Myalgia, unspecified site: Secondary | ICD-10-CM | POA: Diagnosis present

## 2024-10-27 LAB — BASIC METABOLIC PANEL WITH GFR
Anion gap: 16 — ABNORMAL HIGH (ref 5–15)
BUN: 11 mg/dL (ref 8–23)
CO2: 24 mmol/L (ref 22–32)
Calcium: 9.5 mg/dL (ref 8.9–10.3)
Chloride: 104 mmol/L (ref 98–111)
Creatinine, Ser: 0.6 mg/dL (ref 0.44–1.00)
GFR, Estimated: >60 mL/min (ref >60)
Glucose, Bld: 147 mg/dL — ABNORMAL HIGH (ref 70–99)
Potassium: 4 mmol/L (ref 3.5–5.1)
Sodium: 144 mmol/L (ref 135–145)

## 2024-10-27 LAB — URINALYSIS, ROUTINE W REFLEX MICROSCOPIC
Bacteria, UA: NONE SEEN
Bilirubin Urine: NEGATIVE
Glucose, UA: NEGATIVE mg/dL
Hgb urine dipstick: NEGATIVE
Ketones, ur: 5 mg/dL — AB
Leukocytes,Ua: NEGATIVE
Nitrite: NEGATIVE
Protein, ur: 100 mg/dL — AB
Specific Gravity, Urine: 1.014 (ref 1.005–1.030)
pH: 5 (ref 5.0–8.0)

## 2024-10-27 LAB — CK: Total CK: 243 U/L — ABNORMAL HIGH (ref 38–234)

## 2024-10-27 LAB — CBC WITH DIFFERENTIAL/PLATELET
Abs Immature Granulocytes: 0.12 K/uL — ABNORMAL HIGH (ref 0.00–0.07)
Basophils Absolute: 0.1 K/uL (ref 0.0–0.1)
Basophils Relative: 0 %
Eosinophils Absolute: 0 K/uL (ref 0.0–0.5)
Eosinophils Relative: 0 %
HCT: 48.4 % — ABNORMAL HIGH (ref 36.0–46.0)
Hemoglobin: 16.1 g/dL — ABNORMAL HIGH (ref 12.0–15.0)
Immature Granulocytes: 1 %
Lymphocytes Relative: 11 %
Lymphs Abs: 2.1 K/uL (ref 0.7–4.0)
MCH: 30.6 pg (ref 26.0–34.0)
MCHC: 33.3 g/dL (ref 30.0–36.0)
MCV: 91.8 fL (ref 80.0–100.0)
Monocytes Absolute: 1 K/uL (ref 0.1–1.0)
Monocytes Relative: 5 %
Neutro Abs: 16.7 K/uL — ABNORMAL HIGH (ref 1.7–7.7)
Neutrophils Relative %: 83 %
Platelets: 259 K/uL (ref 150–400)
RBC: 5.27 MIL/uL — ABNORMAL HIGH (ref 3.87–5.11)
RDW: 13.7 % (ref 11.5–15.5)
WBC: 20 K/uL — ABNORMAL HIGH (ref 4.0–10.5)
nRBC: 0 % (ref 0.0–0.2)

## 2024-10-27 MED ORDER — SODIUM CHLORIDE 0.9 % IV BOLUS
1000.0000 mL | Freq: Once | INTRAVENOUS | Status: AC
  Administered 2024-10-27: 1000 mL via INTRAVENOUS

## 2024-10-27 MED ORDER — ACETAMINOPHEN 325 MG PO TABS
650.0000 mg | ORAL_TABLET | Freq: Once | ORAL | Status: AC
  Administered 2024-10-27: 650 mg via ORAL
  Filled 2024-10-27: qty 2

## 2024-10-27 MED ORDER — OXYCODONE-ACETAMINOPHEN 5-325 MG PO TABS
1.0000 | ORAL_TABLET | Freq: Once | ORAL | Status: AC
  Administered 2024-10-27: 1 via ORAL
  Filled 2024-10-27: qty 1

## 2024-10-27 NOTE — ED Notes (Signed)
 Patient informed that she would have to wait in lobby for daughter to pick her up. Patient given new brief. AVS given to patient. Pt in no acute distress at time of dispo. GCS 15.

## 2024-10-27 NOTE — Discharge Instructions (Addendum)
 Your tests did not show any signs of acute fracture or serious injury.  I was concerned about your elevated white blood cell count but you declined any further evaluation.  Please follow-up with your doctor to be rechecked.  Return to the emergency room for fevers weakness falls or other concerning symptoms

## 2024-10-27 NOTE — ED Provider Triage Note (Signed)
 Emergency Medicine Provider Triage Evaluation Note  Margaret Rocha , a 78 y.o. female  was evaluated in triage.  Pt lives at home alone, tripped, and fell forward hitting her face. Was down for approx 4 hours prior to her daughter visiting and finding her. No blood thinner use. Daily ASA only. No LOC. Denies headache, neck or back pain. Admits to nasal bone pain. Admits to bilateral elbow pain and right hip pain.   Review of Systems  Positive: - Negative: -  Physical Exam  BP 113/82 (BP Location: Right Arm)   Pulse (!) 110   Temp (!) 97.4 F (36.3 C)   Resp 16   SpO2 98%  Gen:   Awake, no distress  no edema or ecchymosis on face. Nasal boen pain Resp:  Normal effort  MSK:   Moves extremities without difficulty  Other:  Ecchymosis to bilateral elbows. Right hip tenderness. No ecchymosis.   Medical Decision Making  Medically screening exam initiated at 6:58 PM.  Appropriate orders placed.  Talene Glastetter Para was informed that the remainder of the evaluation will be completed by another provider, this initial triage assessment does not replace that evaluation, and the importance of remaining in the ED until their evaluation is complete.     Elnor Bernarda SQUIBB, DO 10/27/24 1901

## 2024-10-27 NOTE — ED Notes (Addendum)
 Patient has signed AMA and denies any further questions. Form signed electronically.

## 2024-10-27 NOTE — ED Provider Notes (Signed)
 [place] EMERGENCY DEPARTMENT AT [facility] Provider Note   CSN: [id number] Arrival date & time: 10/27/24  1814     Patient presents with: [place]   Margaret Rocha is a 78 y.o. female.    Fall   Patient has history of hyperlipidemia hypertension urinary incontinence acid reflux pneumonia diabetes.  Patient presents emergency room for evaluation after a fall.  Patient states she was getting off the toilet when she accidentally fell forward.  Patient struck her head and nose.  Patient states she is had some aching in her arms and legs but no focal areas of swelling or tenderness.  Patient denies any loss of consciousness.  She is not on a blood thinner.    Prior to Admission medications   Medication Sig Start Date End Date Taking? Authorizing Provider  ACIDOPHILUS LACTOBACILLUS PO Take 1 tablet by mouth daily. (10mg ; 2 billion active cultures) Take one tablet by mouth daily.    [provider]  albuterol  (VENTOLIN  HFA) 108 (90 Base) MCG/ACT inhaler Inhale 1-2 puffs into the lungs every 6 (six) hours as needed. 02/14/24   [provider]  amLODipine  (NORVASC ) 5 MG tablet Take 5 mg by mouth daily.  09/11/13   [provider]  aspirin  81 MG chewable tablet Chew 81 mg by mouth daily.    [provider]  losartan  (COZAAR ) 100 MG tablet Take 100 mg by mouth daily.  09/11/13   [provider]  Magnesium  Oxide 250 MG TABS Take 1 tablet by mouth at bedtime. Take one tablet by mouth every evening at bedtime.    [provider]  metFORMIN (GLUCOPHAGE-XR) 500 MG 24 hr tablet Take 1,000 mg by mouth 2 (two) times daily. 02/14/24   [provider]  nystatin cream (MYCOSTATIN) Apply 1 Application topically 2 (two) times daily.    [provider]  Omega-3 Fatty Acids (FISH OIL HIGH POTENCY PO) Take 2,000 mg by mouth daily. Fish Oil 2000mg  (600mg  Omega-3).  Takel one capsule by mouth daily.    [provider]   omeprazole (PRILOSEC) 20 MG capsule Take 20 mg by mouth at bedtime. 01/09/24   [provider]  pravastatin  (PRAVACHOL ) 80 MG tablet Take 80 mg by mouth daily. 02/08/24   [provider]  trimethoprim (TRIMPEX) 100 MG tablet Take 100 mg by mouth daily. Take one tablet by mouth once daily for UTI prevention.    [provider]    Allergies: Prednisone    Review of Systems  Updated Vital Signs BP 113/82 (BP Location: Right Arm)   Pulse (!) 110   Temp (!) 97.4 F (36.3 C)   Resp 16   SpO2 98%   Physical Exam Vitals and nursing note reviewed.  Constitutional:      General: She is not in acute distress.    Appearance: She is well-developed. She is not diaphoretic.  HENT:     Head: Normocephalic and atraumatic.     Right Ear: External ear normal.     Left Ear: External ear normal.  Eyes:     General: No scleral icterus.       Right eye: No discharge.        Left eye: No discharge.     Conjunctiva/sclera: Conjunctivae normal.  Neck:     Trachea: No tracheal deviation.  Cardiovascular:     Rate and Rhythm: Normal rate and regular rhythm.  Pulmonary:     Effort: Pulmonary effort is normal. No respiratory distress.  Breath sounds: Normal breath sounds. No stridor. No wheezing or rales.  Abdominal:     General: Bowel sounds are normal. There is no distension.     Palpations: Abdomen is soft.     Tenderness: There is no abdominal tenderness. There is no guarding or rebound.  Musculoskeletal:        General: No tenderness or deformity.     Cervical back: Normal and neck supple.     Thoracic back: Normal.     Lumbar back: Normal.  Skin:    General: Skin is warm and dry.     Findings: No rash.  Neurological:     General: No focal deficit present.     Mental Status: She is alert.     Cranial Nerves: No cranial nerve deficit, dysarthria or facial asymmetry.     Sensory: No sensory deficit.     Motor: No abnormal muscle tone or seizure activity.      Coordination: Coordination normal.  Psychiatric:        Mood and Affect: Mood normal.     (all labs ordered are listed, but only abnormal results are displayed) Labs Reviewed  CBC WITH DIFFERENTIAL/PLATELET - Abnormal; Notable for the following components:      Result Value   WBC 20.0 (*)    RBC 5.27 (*)    Hemoglobin 16.1 (*)    HCT 48.4 (*)    Neutro Abs 16.7 (*)    Abs Immature Granulocytes 0.12 (*)    All other components within normal limits  BASIC METABOLIC PANEL WITH GFR - Abnormal; Notable for the following components:   Glucose, Bld 147 (*)    Anion gap 16 (*)    All other components within normal limits  CK - Abnormal; Notable for the following components:   Total CK 243 (*)    All other components within normal limits  URINALYSIS, ROUTINE W REFLEX MICROSCOPIC - Abnormal; Notable for the following components:   APPearance HAZY (*)    Ketones, ur 5 (*)    Protein, ur 100 (*)    All other components within normal limits    EKG: None  Radiology: DG Pelvis 1-2 Views Result Date: 10/27/2024 EXAM: 1 or 2 VIEW(S) XRAY OF THE PELVIS 10/27/2024 08:55:00 PM COMPARISON: X-ray lumbar spine 09/04/2024. CLINICAL HISTORY: fall, righthip pain FINDINGS: BONES AND JOINTS: No acute fracture. No focal osseous lesion. At least mild degenerative changes of bilateral hips. Pubic symphysis degenerative changes. No pelvic diastasis. Degenerative changes of the lumbar spine. SOFT TISSUES: The soft tissues are unremarkable. LIMITATIONS: Limited evaluation due to overlapping osseous structures and overlying soft tissues. IMPRESSION: 1. No acute findings. Electronically signed by: Morgane Naveau MD 10/27/2024 09:30 PM EST RP Workstation: [id number]   DG Chest 1 View Result Date: 10/27/2024 CLINICAL DATA:  Status post fall. EXAM: CHEST  1 VIEW COMPARISON:  May 07, 2024 FINDINGS: The cardiac silhouette is mildly enlarged and unchanged in size. Mild linear atelectasis is seen within the mid left  lung. No acute infiltrate, pleural effusion or pneumothorax is identified. There is stable marked severity dextroscoliosis of the thoracic spine. IMPRESSION: Mild mid left lung linear atelectasis. Electronically Signed   By: Suzen Dials M.D.   On: 10/27/2024 20:39   DG Elbow 2 Views Right Result Date: 10/27/2024 CLINICAL DATA:  Status post fall. EXAM: RIGHT ELBOW - 2 VIEW COMPARISON:  None Available. FINDINGS: There is no evidence of fracture, dislocation, or joint effusion. Mild bony spurring is seen along the  right coronoid process. Mild posterior soft tissue swelling is present. IMPRESSION: Mild posterior soft tissue swelling without evidence of an acute osseous abnormality. Electronically Signed   By: Suzen Dials M.D.   On: 10/27/2024 20:36   DG Elbow 2 Views Left Result Date: 10/27/2024 CLINICAL DATA:  Status post fall. EXAM: LEFT ELBOW - 2 VIEW COMPARISON:  None Available. FINDINGS: There is no evidence of an acute fracture, dislocation, or joint effusion. Very mild bony spurring is seen along the coronoid process. Soft tissues are unremarkable. IMPRESSION: No acute osseous abnormality. Electronically Signed   By: Suzen Dials M.D.   On: 10/27/2024 20:35   CT Head Wo Contrast Result Date: 10/27/2024 EXAM: CT HEAD, FACIAL BONES AND CERVICAL SPINE WITHOUT CONTRAST 10/27/2024 07:44:51 PM TECHNIQUE: CT of the head, facial bones and cervical spine was performed without the administration of intravenous contrast. Multiplanar reformatted images are provided for review. Automated exposure control, iterative reconstruction, and/or weight based adjustment of the mA/kV was utilized to reduce the radiation dose to as low as reasonably achievable. COMPARISON: CT head 05/07/2024 CLINICAL HISTORY: Head trauma, minor (Age >= 65y) FINDINGS: CT HEAD BRAIN AND VENTRICLES: No acute intracranial hemorrhage. No mass effect or midline shift. No extra-axial fluid collection. No evidence of acute infarct.  No hydrocephalus. Patchy white matter hypodensities, compatible with chronic microvascular ischemic change. SKULL AND SCALP: No acute skull fracture. No scalp hematoma. CT FACIAL BONES FACIAL BONES: No acute facial fracture. Remote mild nasal bone deformity, unchanged. No mandibular dislocation. ORBITS: No acute traumatic injury. SINUSES AND MASTOIDS: No acute abnormality. SOFT TISSUES: No acute abnormality. CT CERVICAL SPINE BONES AND ALIGNMENT: No acute fracture or traumatic malalignment. DEGENERATIVE CHANGES: Moderate degenerative disc disease greatest at C5-C6 and C6-C7. SOFT TISSUES: No prevertebral soft tissue swelling. IMPRESSION: 1. No acute intracranial abnormality. 2. No acute fracture or traumatic malalignment of the cervical spine. 3. No acute fracture of the facial bones. Electronically signed by: Gilmore Molt MD 10/27/2024 08:08 PM EST RP Workstation: [id number]   CT Maxillofacial Wo Contrast Result Date: 10/27/2024 EXAM: CT HEAD, FACIAL BONES AND CERVICAL SPINE WITHOUT CONTRAST 10/27/2024 07:44:51 PM TECHNIQUE: CT of the head, facial bones and cervical spine was performed without the administration of intravenous contrast. Multiplanar reformatted images are provided for review. Automated exposure control, iterative reconstruction, and/or weight based adjustment of the mA/kV was utilized to reduce the radiation dose to as low as reasonably achievable. COMPARISON: CT head 05/07/2024 CLINICAL HISTORY: Head trauma, minor (Age >= 65y) FINDINGS: CT HEAD BRAIN AND VENTRICLES: No acute intracranial hemorrhage. No mass effect or midline shift. No extra-axial fluid collection. No evidence of acute infarct. No hydrocephalus. Patchy white matter hypodensities, compatible with chronic microvascular ischemic change. SKULL AND SCALP: No acute skull fracture. No scalp hematoma. CT FACIAL BONES FACIAL BONES: No acute facial fracture. Remote mild nasal bone deformity, unchanged. No mandibular dislocation.  ORBITS: No acute traumatic injury. SINUSES AND MASTOIDS: No acute abnormality. SOFT TISSUES: No acute abnormality. CT CERVICAL SPINE BONES AND ALIGNMENT: No acute fracture or traumatic malalignment. DEGENERATIVE CHANGES: Moderate degenerative disc disease greatest at C5-C6 and C6-C7. SOFT TISSUES: No prevertebral soft tissue swelling. IMPRESSION: 1. No acute intracranial abnormality. 2. No acute fracture or traumatic malalignment of the cervical spine. 3. No acute fracture of the facial bones. Electronically signed by: Gilmore Molt MD 10/27/2024 08:08 PM EST RP Workstation: [id number]   CT Cervical Spine Wo Contrast Result Date: 10/27/2024 EXAM: CT HEAD, FACIAL BONES AND CERVICAL SPINE WITHOUT CONTRAST 10/27/2024 07:44:51 PM  TECHNIQUE: CT of the head, facial bones and cervical spine was performed without the administration of intravenous contrast. Multiplanar reformatted images are provided for review. Automated exposure control, iterative reconstruction, and/or weight based adjustment of the mA/kV was utilized to reduce the radiation dose to as low as reasonably achievable. COMPARISON: CT head 05/07/2024 CLINICAL HISTORY: Head trauma, minor (Age >= 65y) FINDINGS: CT HEAD BRAIN AND VENTRICLES: No acute intracranial hemorrhage. No mass effect or midline shift. No extra-axial fluid collection. No evidence of acute infarct. No hydrocephalus. Patchy white matter hypodensities, compatible with chronic microvascular ischemic change. SKULL AND SCALP: No acute skull fracture. No scalp hematoma. CT FACIAL BONES FACIAL BONES: No acute facial fracture. Remote mild nasal bone deformity, unchanged. No mandibular dislocation. ORBITS: No acute traumatic injury. SINUSES AND MASTOIDS: No acute abnormality. SOFT TISSUES: No acute abnormality. CT CERVICAL SPINE BONES AND ALIGNMENT: No acute fracture or traumatic malalignment. DEGENERATIVE CHANGES: Moderate degenerative disc disease greatest at C5-C6 and C6-C7. SOFT TISSUES: No  prevertebral soft tissue swelling. IMPRESSION: 1. No acute intracranial abnormality. 2. No acute fracture or traumatic malalignment of the cervical spine. 3. No acute fracture of the facial bones. Electronically signed by: Gilmore Molt MD 10/27/2024 08:08 PM EST RP Workstation: [id number]     Procedures   Medications Ordered in the ED  sodium chloride  0.9 % bolus 1,000 mL (1,000 mLs Intravenous New Bag/Given 10/27/24 2035)  acetaminophen  (TYLENOL ) tablet 650 mg (650 mg Oral Given 10/27/24 2034)  oxyCODONE -acetaminophen  (PERCOCET/ROXICET) 5-325 MG per tablet 1 tablet (1 tablet Oral Given 10/27/24 2116)    Clinical Course as of 10/27/24 2145  Tue Oct 27, 2024  2051 CBC with Differential(!) White blood cell count elevated compared to previous.  Metabolic panel unremarkable. [JK]  2051 Chest x-ray without acute findings [JK]  2052 Head CT C-spine CT maxillofacial CT without acute fracture [JK]  2110 Reviewed findings with patient.  She is very frustrated that she is here.  Explained to her as concerned of her elevated white blood cell count.  Patient states she does not think that is concerning.  Explained to her that it can be signs of a severe bloodstream infection which could be dangerous.  Patient states she is not concerned about that and does not really want to be here.  She does not care she gets sicker.  She did not want to come to the ED.  Patient will wait for her urinalysis sample.  She will be allowed to eat and drink [JK]  2121 Urinalysis, Routine w reflex microscopic -Urine, Clean Catch(!) Urinalysis not suggestive of infection [JK]  2140 Patient is refusing additional lab testing.  She declines lactic acid testing [JK]  2142 Pelvis x-ray without acute finding [JK]    Clinical Course User Index [JK] Randol Simmonds, MD                                 Medical Decision Making Problems Addressed: Fall, initial encounter: acute illness or injury that poses a threat to life or  bodily functions Leukocytosis, unspecified type: acute illness or injury that poses a threat to life or bodily functions  Amount and/or Complexity of Data Reviewed Labs: ordered. Decision-making details documented in ED Course. Radiology: ordered and independent interpretation performed.  Risk Prescription drug management.   Patient presented to the ED for evaluation after mechanical fall.  Patient fell forward off the toilet.  Patient states she has trouble with her  balance and normally has to use a walker.  Patient denies any fevers or chills.  She denies any loss of consciousness.  Her x-rays fortunately do not show any signs of serious injury.  No evidence of closed injury no C-spine fracture.  No facial bone fracture.  Patient did have an elevated white blood cell count.  I was concerned about the possibility of occult infection.  Patient however declines any further testing in the ED.  I was going to order lactic acid level however patient is refusing.  She states she is had an elevated white blood cell count before.  She is not concerned if she has an infection and does not want to be here any longer.  I did explain to the patient that sometimes this can be an indication of a dangerous life-threatening bloodstream infection.  Patient states she is aware of that.  She states she is 78 years old and does not want to do any further testing.  Patient will be discharged AMA.  We will let her contact her family to make arrangements to go home.  She does not want me or the nurses speaking to her family members      Final diagnoses:  Leukocytosis, unspecified type  Fall, initial encounter    ED Discharge Orders     None          Randol Simmonds, MD 10/27/24 2145

## 2024-10-27 NOTE — ED Triage Notes (Signed)
 Pt bib ems from home c.o fall. Pt fell about 4 hours ago after getting off the toilet, pt fell forward and hit the bridge of her nose and forehead, no loc or dizziness. No blood thinner. Pt c.o body aches all over

## 2024-10-27 NOTE — ED Notes (Addendum)
 Per pt, she does not consent to any information being called over phone to anyone at this time. This RN has asked patient if she consents to me giving medical information to patient's daughter and patient states No, I do not consent to that after how I've been treated today by them. Pt is requesting phone to make phone call to family member. Patient was provided phone to make phone call and states that a family member would be on the way to pick patient up. EDP made aware of pt decision to not update daughter on medical info.

## 2024-10-27 NOTE — ED Notes (Signed)
 EDP has spoken with patient and educated patient on preferred plan of care but patient refusing further intervention. Patient states her daughter is on the way to get patient.

## 2024-10-30 ENCOUNTER — Telehealth (HOSPITAL_COMMUNITY): Payer: Self-pay

## 2024-10-30 NOTE — Telephone Encounter (Signed)
 Pt. Called and left a Vm for a return call.  She reports that someone called her and left her a message but she deleted the message.  She thought it was from [facility]. I contacted patient and verified her ID.  I informed her that I did not contact her and I did not see any notes referring to a phone call.  I also stated that does not mean that someone from [facility] Did not call. She verbalized understanding and did not have any further questions
